# Patient Record
Sex: Female | Born: 1970 | Race: Black or African American | Hispanic: No | Marital: Married | Smoking: Never smoker
Health system: Southern US, Community
[De-identification: ages and names within clinical notes are randomized; demographics above are authoritative.]

## PROBLEM LIST (undated history)

## (undated) DIAGNOSIS — Z309 Encounter for contraceptive management, unspecified: Secondary | ICD-10-CM

## (undated) DIAGNOSIS — I10 Essential (primary) hypertension: Secondary | ICD-10-CM

## (undated) DIAGNOSIS — E7800 Pure hypercholesterolemia, unspecified: Secondary | ICD-10-CM

## (undated) HISTORY — DX: Essential (primary) hypertension: I10

## (undated) HISTORY — DX: Pure hypercholesterolemia, unspecified: E78.00

## (undated) HISTORY — DX: Encounter for contraceptive management, unspecified: Z30.9

---

## 2007-06-26 ENCOUNTER — Ambulatory Visit: Payer: Self-pay | Admitting: Family Medicine

## 2007-06-30 ENCOUNTER — Encounter: Payer: Self-pay | Admitting: Family Medicine

## 2007-06-30 LAB — CONVERTED CEMR LAB
BUN: 17 mg/dL (ref 6–23)
Basophils Absolute: 0 10*3/uL (ref 0.0–0.1)
Basophils Relative: 0 % (ref 0–1)
CO2: 23 meq/L (ref 19–32)
Calcium: 9.5 mg/dL (ref 8.4–10.5)
Chloride: 105 meq/L (ref 96–112)
Cholesterol: 221 mg/dL — ABNORMAL HIGH (ref 0–200)
Creatinine, Ser: 0.99 mg/dL (ref 0.40–1.50)
Eosinophils Absolute: 0.1 10*3/uL (ref 0.0–0.7)
Eosinophils Relative: 1 % (ref 0–5)
Glucose, Bld: 92 mg/dL (ref 70–99)
HCT: 40.4 % (ref 39.0–52.0)
HDL: 77 mg/dL (ref >39)
Hemoglobin: 13.8 g/dL (ref 13.0–17.0)
LDL Cholesterol: 133 mg/dL — ABNORMAL HIGH (ref 0–99)
Lymphocytes Relative: 54 % — ABNORMAL HIGH (ref 12–46)
Lymphs Abs: 3.1 10*3/uL (ref 0.7–3.3)
MCHC: 34.2 g/dL (ref 30.0–36.0)
MCV: 85.1 fL (ref 78.0–100.0)
Monocytes Absolute: 0.6 10*3/uL (ref 0.2–0.7)
Monocytes Relative: 10 % (ref 3–11)
Neutro Abs: 2 10*3/uL (ref 1.7–7.7)
Neutrophils Relative %: 35 % — ABNORMAL LOW (ref 43–77)
Platelets: 208 10*3/uL (ref 150–400)
Potassium: 4.2 meq/L (ref 3.5–5.3)
RBC: 4.75 M/uL (ref 4.22–5.81)
RDW: 13.7 % (ref 11.5–14.0)
Sodium: 139 meq/L (ref 135–145)
TSH: 1.754 microintl units/mL (ref 0.350–5.50)
Total CHOL/HDL Ratio: 2.9
Triglycerides: 55 mg/dL (ref <150)
VLDL: 11 mg/dL (ref 0–40)
WBC: 5.7 10*3/uL (ref 4.0–10.5)

## 2007-07-23 ENCOUNTER — Other Ambulatory Visit: Admission: RE | Admit: 2007-07-23 | Discharge: 2007-07-23 | Payer: Self-pay | Admitting: Obstetrics and Gynecology

## 2007-10-03 ENCOUNTER — Encounter: Payer: Self-pay | Admitting: Family Medicine

## 2008-07-29 ENCOUNTER — Ambulatory Visit: Payer: Self-pay | Admitting: Family Medicine

## 2008-07-29 DIAGNOSIS — E663 Overweight: Secondary | ICD-10-CM | POA: Insufficient documentation

## 2008-07-29 DIAGNOSIS — E66811 Obesity, class 1: Secondary | ICD-10-CM | POA: Insufficient documentation

## 2008-07-29 DIAGNOSIS — E669 Obesity, unspecified: Secondary | ICD-10-CM | POA: Insufficient documentation

## 2008-07-29 DIAGNOSIS — E785 Hyperlipidemia, unspecified: Secondary | ICD-10-CM | POA: Insufficient documentation

## 2008-08-02 DIAGNOSIS — L259 Unspecified contact dermatitis, unspecified cause: Secondary | ICD-10-CM | POA: Insufficient documentation

## 2008-08-02 DIAGNOSIS — K1370 Unspecified lesions of oral mucosa: Secondary | ICD-10-CM | POA: Insufficient documentation

## 2008-08-11 ENCOUNTER — Encounter: Payer: Self-pay | Admitting: Family Medicine

## 2008-09-03 ENCOUNTER — Other Ambulatory Visit: Admission: RE | Admit: 2008-09-03 | Discharge: 2008-09-03 | Payer: Self-pay | Admitting: Obstetrics and Gynecology

## 2008-11-15 ENCOUNTER — Encounter: Payer: Self-pay | Admitting: Family Medicine

## 2009-02-07 ENCOUNTER — Telehealth: Payer: Self-pay | Admitting: Family Medicine

## 2009-02-28 ENCOUNTER — Ambulatory Visit: Payer: Self-pay | Admitting: Family Medicine

## 2009-02-28 DIAGNOSIS — J370 Chronic laryngitis: Secondary | ICD-10-CM | POA: Insufficient documentation

## 2009-03-14 ENCOUNTER — Telehealth: Payer: Self-pay | Admitting: Family Medicine

## 2009-03-18 ENCOUNTER — Telehealth: Payer: Self-pay | Admitting: Family Medicine

## 2009-04-21 ENCOUNTER — Encounter: Admission: RE | Admit: 2009-04-21 | Discharge: 2009-04-21 | Payer: Self-pay | Admitting: Family Medicine

## 2009-04-21 ENCOUNTER — Encounter: Payer: Self-pay | Admitting: Family Medicine

## 2009-05-02 ENCOUNTER — Telehealth: Payer: Self-pay | Admitting: Family Medicine

## 2009-05-03 ENCOUNTER — Ambulatory Visit: Payer: Self-pay | Admitting: Family Medicine

## 2009-05-03 DIAGNOSIS — F411 Generalized anxiety disorder: Secondary | ICD-10-CM | POA: Insufficient documentation

## 2009-05-03 DIAGNOSIS — R519 Headache, unspecified: Secondary | ICD-10-CM | POA: Insufficient documentation

## 2009-05-03 DIAGNOSIS — R51 Headache: Secondary | ICD-10-CM | POA: Insufficient documentation

## 2009-05-19 ENCOUNTER — Telehealth: Payer: Self-pay | Admitting: Family Medicine

## 2009-08-31 ENCOUNTER — Ambulatory Visit: Payer: Self-pay | Admitting: Family Medicine

## 2009-09-02 ENCOUNTER — Encounter: Payer: Self-pay | Admitting: Family Medicine

## 2009-09-07 ENCOUNTER — Other Ambulatory Visit: Admission: RE | Admit: 2009-09-07 | Discharge: 2009-09-07 | Payer: Self-pay | Admitting: Obstetrics and Gynecology

## 2009-10-12 ENCOUNTER — Telehealth (INDEPENDENT_AMBULATORY_CARE_PROVIDER_SITE_OTHER): Payer: Self-pay | Admitting: *Deleted

## 2009-10-26 ENCOUNTER — Encounter: Payer: Self-pay | Admitting: Family Medicine

## 2009-11-28 ENCOUNTER — Telehealth: Payer: Self-pay | Admitting: Family Medicine

## 2010-02-14 ENCOUNTER — Telehealth: Payer: Self-pay | Admitting: Family Medicine

## 2010-03-13 ENCOUNTER — Ambulatory Visit: Payer: Self-pay | Admitting: Family Medicine

## 2010-03-13 DIAGNOSIS — M549 Dorsalgia, unspecified: Secondary | ICD-10-CM | POA: Insufficient documentation

## 2010-03-29 ENCOUNTER — Telehealth: Payer: Self-pay | Admitting: Family Medicine

## 2010-05-09 ENCOUNTER — Encounter: Admission: RE | Admit: 2010-05-09 | Discharge: 2010-05-09 | Payer: Self-pay | Admitting: Family Medicine

## 2010-05-22 ENCOUNTER — Telehealth: Payer: Self-pay | Admitting: Family Medicine

## 2010-07-10 ENCOUNTER — Telehealth: Payer: Self-pay | Admitting: Family Medicine

## 2010-07-18 ENCOUNTER — Encounter: Payer: Self-pay | Admitting: Family Medicine

## 2010-07-27 ENCOUNTER — Encounter: Payer: Self-pay | Admitting: Family Medicine

## 2010-09-11 ENCOUNTER — Encounter: Payer: Self-pay | Admitting: Family Medicine

## 2010-09-11 ENCOUNTER — Other Ambulatory Visit
Admission: RE | Admit: 2010-09-11 | Discharge: 2010-09-11 | Payer: Self-pay | Source: Home / Self Care | Admitting: Obstetrics and Gynecology

## 2010-09-26 ENCOUNTER — Telehealth: Payer: Self-pay | Admitting: Family Medicine

## 2010-10-24 NOTE — Progress Notes (Signed)
Summary: CALL  Phone Note Call from Patient   Summary of Call: SAID DR. CALLED AND LEFT MESSAGE CALL HER BACK AT [phone] EXT. [phone] Initial call taken by: Lind Guest,  Feb 14, 2010 11:04 AM  Follow-up for Phone Call        multiple msgs left , apersonal call however, Follow-up by: Syliva Overman MD,  Feb 20, 2010 1:46 AM

## 2010-10-24 NOTE — Letter (Signed)
Summary: [facility]  [facility]  neurology   Imported By: Lind Guest 07/27/2010 14:34:04  _____________________________________________________________________  External Attachment:    Type:   Image     Comment:   External Document

## 2010-10-24 NOTE — Progress Notes (Signed)
Summary: CALL BACK  Phone Note Call from Patient   Summary of Call: WANTS DR SIMPSON TO CALL HER AT [phone] EXT. [phone] Initial call taken by: Lind Guest,  May 22, 2010 10:36 AM  Follow-up for Phone Call        ok for pt to see me as new pt, his name is Sarah Lutz Follow-up by: Syliva Overman MD,  May 22, 2010 12:03 PM  Additional Follow-up for Phone Call Additional follow up Details #1::        left message Additional Follow-up by: Lind Guest,  May 22, 2010 12:52 PM    Additional Follow-up for Phone Call Additional follow up Details #2::    appt. 10.17.11 [name] Follow-up by: Lind Guest,  May 24, 2010 9:42 AM

## 2010-10-24 NOTE — Assessment & Plan Note (Signed)
Summary: office visit   Vital Signs:  Patient profile:   40 year old female Menstrual status:  regular Height:      59 inches O2 Sat:      98 % Pulse rate:   82 / minute Pulse rhythm:   regular Resp:     16 per minute BP sitting:   102 / 70  (left arm) Cuff size:   regular  Vitals Entered By: Everitt Amber LPN (March 13, 2010 10:32 AM) CC: Follow up chronic problems Comments Patient refused weight due to excess weight from uniform and equipment   CC:  Follow up chronic problems.  History of Present Illness: 6 day h/o intermittent mid back pain, worse on the weekend. No identifiable trigger, but is aggravated by movenmnt, relieved by Bayer asprin. Even tyingher shoelaxces is difficult. Reports  that she has otherwise been doing well. She recently got married, and is hoping to conceive in the near future. Denies recent fever or chills. Denies sinus pressure, nasal congestion , ear pain or sore throat. Denies chest congestion, or cough productive of sputum. Denies chest pain, palpitations, PND, orthopnea or leg swelling. Denies abdominal pain, nausea, vomitting, diarrhea or constipation. Denies change in bowel movements or bloody stool. Denies dysuria , frequency, incontinence or hesitancy.  Denies headaches, vertigo, seizures. Denies depression, anxiety or insomnia. Denies  rash, lesions, or itch.       Current Medications (verified): 1)  None  Allergies (verified): No Known Drug Allergies  Past History:  Past medical, surgical, family and social histories (including risk factors) reviewed, and no changes noted (except as noted below).  Past Medical History: Reviewed history from 07/29/2008 and no changes required. none  Past Surgical History: Reviewed history from 07/29/2008 and no changes required. none  Family History: Reviewed history from 07/29/2008 and no changes required. Family History Lung cancer-both Mother and Father Mother:49 died at age 16  BOOP Father:49, metastatic lung cancer, kidney stones Sister:33yo, healthy,  Brother:33yo, healthy    Social History: Reviewed history from 07/29/2008 and no changes required. Occupation: Scientist, product/process development and hairsylist Married in 2011 Never Smoked Alcohol use-no Drug use-no Regular exercise-no died age   Review of Systems      See HPI Eyes:  Denies blurring and discharge. Endo:  Denies excessive thirst and excessive urination. Heme:  Denies abnormal bruising and bleeding. Allergy:  Denies hives or rash and itching eyes.  Physical Exam  General:  Well-developed,well-nourished,in no acute distress; alert,appropriate and cooperative throughout examination HEENT: No facial asymmetry,  EOMI, No sinus tenderness, TM's Clear, oropharynx  pink and moist.   Chest: Clear to auscultation bilaterally.  CVS: S1, S2, No murmurs, No S3.   Abd: Soft, Nontender.  MS: Adequate ROM spine, hips, shoulders and knees.  Ext: No edema.   CNS: CN 2-12 intact, power tone and sensation normal throughout.   Skin: Intact, no visible lesions or rashes.  Psych: Good eye contact, normal affect.  Memory intact, not anxious or depressed appearing.    Impression & Recommendations:  Problem # 1:  BACK PAIN (ICD-724.5) Assessment Comment Only  Her updated medication list for this problem includes:    Vimovo 500-20 Mg Tbec (Naproxen-esomeprazole) .Marland Kitchen... Take 1 tablet by mouth two times a day for 5 to 7 days as needed for back pain , then use only for acute pain  Orders: Radiology Referral (Radiology)  Problem # 2:  ANXIETY STATE, UNSPECIFIED (ICD-300.00) Assessment: Improved  Problem # 3:  CHRONIC LARYNGITIS (ICD-476.0) Assessment: Improved  Problem # 4:  HYPERLIPIDEMIA (ICD-272.4) Assessment: Comment Only low fat diet discussed and encouraged  Complete Medication List: 1)  Vimovo 500-20 Mg Tbec (Naproxen-esomeprazole) .... Take 1 tablet by mouth two times a day for 5 to 7 days as needed for  back pain , then use only for acute pain 2)  Sm Prenatal Vitamins 28-0.8 Mg Tabs (Prenatal vit-fe fumarate-fa) .... Take 1 tablet by mouth once a day  Patient Instructions: 1)  F/U in 5 months and 3 weeks 2)  It is important that you exercise regularly at least 20 minutes 5 times a week. If you develop chest pain, have severe difficulty breathing, or feel very tired , stop exercising immediately and seek medical attention. 3)  You need to lose weight. Consider a lower calorie diet and regular exercise.  4)  You will be treated for back pain due to presumed arthritis. 5)  It is important for you to start taking a multivit with folic acid , and I suggestyou see a fertility specialist if you do not conceive within the next 6 months 6)  You dio not need top do the xray of your back if your pain resolves in the next week Prescriptions: SM PRENATAL VITAMINS 28-0.8 MG TABS (PRENATAL VIT-FE FUMARATE-FA) Take 1 tablet by mouth once a day  #30 x 5   Entered and Authorized by:   Syliva Overman MD   Signed by:   Syliva Overman MD on 03/13/2010   Method used:   Print then Give to Patient   RxID:   [id number] VIMOVO 500-20 MG TBEC (NAPROXEN-ESOMEPRAZOLE) Take 1 tablet by mouth two times a day for 5 to 7 days as needed for back pain , then use only for acute pain  #60 x 0   Entered and Authorized by:   Syliva Overman MD   Signed by:   Syliva Overman MD on 03/13/2010   Method used:   Print then Give to Patient   RxID:   [id number]

## 2010-10-24 NOTE — Letter (Signed)
Summary: Out of Work  [facility]  [address]   [place], [place] [address]   Phone: [phone]  Fax: [phone]    May 03, 2009   Employee:  Sarah Lutz    To Whom It May Concern:   For Medical reasons, please excuse the above named employee from work for the following dates:  Start:   05/04/09  End:   05/09/09  If you need additional information, please feel free to contact our office.         Sincerely,    Worthy Keeler LPN

## 2010-10-24 NOTE — Progress Notes (Signed)
  Phone Note Call from Patient   Caller: Patient Details for Reason: still hoarse Summary of Call: using her allergy pills daily, still very hoarse tough it comes and goes. She denies obvious reflux symptoms.She has a [place] appt for [employer]. i will start omeprazole empirically. She reports little caffeine intake and does not smoke Initial call taken by: Syliva Overman MD,  March 18, 2009 1:06 PM    New/Updated Medications: OMEPRAZOLE 20 MG CPDR (OMEPRAZOLE) Take 1 capsule by mouth once a day   Prescriptions: OMEPRAZOLE 20 MG CPDR (OMEPRAZOLE) Take 1 capsule by mouth once a day  #30 x 3   Entered and Authorized by:   Syliva Overman MD   Signed by:   Syliva Overman MD on 03/18/2009   Method used:   Electronically to        [facility] [address] [phone]* (retail)       [address] [place], [place]  [address]       Ph: [phone]       Fax: [phone]   RxID:   [id number]

## 2010-10-24 NOTE — Assessment & Plan Note (Signed)
Summary: OV   Vital Signs:  Patient profile:   40 year old female Menstrual status:  regular Height:      59 inches Weight:      145.25 pounds BMI:     29.44 Pulse rate:   75 / minute Resp:     16 per minute BP sitting:   116 / 70  (left arm)  Vitals Entered By: Worthy Keeler LPN (May 03, 2009 3:05 PM)  Nutrition Counseling: Patient's BMI is greater than 25 and therefore counseled on weight management options. CC: stress Is Patient Diabetic? No Pain Assessment Patient in pain? no        CC:  stress.  History of Present Illness: pt c/o increased stress in the past 2 weeks. 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 She reports poor sleep, and poorapetitie, irritability and anger about the situation, states she has threatened to quit.statesshe needs a mental health break. She has been trying to [employer] healthily and exercise , howwevere these lastcouple of weeksthis has not been possible. She also c/o frontal and temporal tightness. She denies localised weakness, numbness or any associated aura, she feels her symptoms is stress related also.   Current Medications (verified): 1)  Ortho Tri-Cyclen (28) 0.18/0.215/0.25 Mg-35 Mcg Tabs (Norgestim-Eth Estrad Triphasic) .... Uad  Allergies (verified): No Known Drug Allergies  Review of Systems General:  Complains of fatigue and sleep disorder; denies chills and fever. ENT:  Denies hoarseness, nasal congestion, sinus pressure, and sore throat. CV:  Denies chest pain or discomfort, palpitations, and swelling of feet. Resp:  Denies cough and sputum productive. GI:  See HPI; Complains of loss of appetite; denies abdominal pain, constipation, diarrhea, indigestion, nausea, and vomiting. GU:  Denies dysuria and urinary frequency. MS:  Denies joint pain and stiffness. Derm:  Denies itching, lesion(s), and rash. Neuro:   Complains of headaches; denies memory loss, poor balance, seizures, and sensation of room spinning. Psych:  Complains of anxiety, easily tearful, and irritability; denies suicidal thoughts/plans, thoughts of violence, and unusual visions or sounds; increased stress on the job for the past 2 weeks., she is doing the job for the other co-workers who do not do their job. Endo:  Denies cold intolerance, excessive thirst, excessive urination, and heat intolerance. Heme:  Denies abnormal bruising and bleeding. Allergy:  Denies hives or rash and itching eyes.  Physical Exam  General:  alert, well-nourished, and well-hydrated.  HEENT: No facial asymmetry,  EOMI, No sinus tenderness, TM's Clear, oropharynx  pink and moist.   Chest: Clear to auscultation bilaterally.  CVS: S1, S2, No murmurs, No S3.   Abd: Soft, Nontender.  MS: Adequate ROM spine, hips, shoulders and knees.  Ext: No edema.   CNS: CN 2-12 intact, power tone and sensation normal throughout.   Skin: Intact, no visible lesions or rashes.  Psych: Good eye contact, normal affect.  Memory intact, anxious  appearing.     Impression & Recommendations:  Problem # 1:  ANXIETY STATE, UNSPECIFIED (ICD-300.00) Assessment Deteriorated pt received counselling about coping with job stress and ventilated for approx 10 mins. No medication desired or needed at this time . Wk excuse granted   Problem # 2:  OVERWEIGHT (ICD-278.02) Assessment: Unchanged  Ht: 59 (05/03/2009)   Wt: 145.25 (05/03/2009)   BMI: 29.44 (05/03/2009)  Problem # 3:  HEADACHE (ICD-784.0) Assessment: Comment Only no neurologic deficits  on exam, and no alert symptoms, ibelieve this is due to sleep deprivation and tension, advised use of anti-inflammatory or tylenol if needed  Complete Medication List: 1)  Ortho Tri-cyclen (28) 0.18/0.215/0.25 Mg-35 Mcg Tabs (Norgestim-eth estrad triphasic) .... Uad  Other Orders: T-Basic Metabolic Panel [id number]) T-CBC w/Diff  [id number]) T-Lipid Profile [id number]) T-TSH [id number])  Patient Instructions: 1)  F/U in 5.5 months. 2)  It is important that you exercise regularly at least 30 minutes 6 times a week. If you develop chest pain, have severe difficulty breathing, or feel very tired , stop exercising immediately and seek medical attention. 3)  You need to lose weight. Consider a lower calorie diet and regular exercise.  4)  Pls  eat healthily and ensure you get  8 hrs of sleep regularly. 5)  Work excuse start  on 05/04/2009 and return  05/09/2009 6)  BMP prior to visit, ICD-9: 7)  Lipid Panel prior to visit, ICD-9: 8)  TSH prior to visit, ICD-9: 9)  CBC w/ Diff prior to visit, ICD-9:

## 2010-10-24 NOTE — Progress Notes (Signed)
Summary: rx  Phone Note Call from Patient   Summary of Call: would like to get medicine that will  not make her sick on cruise. [phone] ext [phone]    Follow-up for Phone Call        pls advise med is sent to her pharmacy, and enjoy the cruise Follow-up by: Syliva Overman MD,  November 30, 2009 12:35 PM  Additional Follow-up for Phone Call Additional follow up Details #1::        called patient, left message Additional Follow-up by: Adella Hare LPN,  November 30, 2009 1:10 PM    Additional Follow-up for Phone Call Additional follow up Details #2::    rx sent, patient aware Follow-up by: Adella Hare LPN,  December 01, 2009 9:05 AM  New/Updated Medications: TRANSDERM-SCOP 1.5 MG PT72 (SCOPOLAMINE BASE) apply 1 disc ehind ear 4 hours before sailing, replace every 3 days Prescriptions: TRANSDERM-SCOP 1.5 MG PT72 (SCOPOLAMINE BASE) apply 1 disc ehind ear 4 hours before sailing, replace every 3 days  #8 x 0   Entered and Authorized by:   Syliva Overman MD   Signed by:   Syliva Overman MD on 11/30/2009   Method used:   Electronically to        [facility] [address] [phone]* (retail)       [address], [place]  [address]       Ph: [phone]       Fax: [phone]   RxID:   [id number]

## 2010-10-24 NOTE — Letter (Signed)
Summary: Out of Work  All     ,     Phone:   Fax:     February 28, 2009   Employee:  Ardyth Man    To Whom It May Concern:   For Medical reasons, please excuse the above named employee from work for the following dates:  Start:   03/01/09  End:   03/03/09  If you need additional information, please feel free to contact our office.         Sincerely,    Worthy Keeler LPN

## 2010-10-24 NOTE — Progress Notes (Signed)
[facility]   Imported By: Lind Guest 09/09/2009 12:20:30  _____________________________________________________________________  External Attachment:    Type:   Image     Comment:   External Document

## 2010-10-24 NOTE — Assessment & Plan Note (Signed)
Summary: office visit   Vital Signs:  Patient profile:   40 year old female Menstrual status:  regular Height:      59 inches Weight:      136.25 pounds BMI:     27.62 O2 Sat:      98 % Pulse rate:   84 / minute Pulse rhythm:   regular Resp:     16 per minute BP sitting:   112 / 72 Cuff size:   regular  Vitals Entered By: Everitt Amber (August 31, 2009 8:01 AM)  Nutrition Counseling: Patient's BMI is greater than 25 and therefore counseled on weight management options. CC: follow-up visit   CC:  follow-up visit.  History of Present Illness: Reports  that they are doing well. Denies recent fever or chills. Denies sinus pressure, nasal congestion , ear pain or sore throat. Denies chest congestion, or cough productive of sputum. Denies chest pain, palpitations, PND, orthopnea or leg swelling. Denies abdominal pain, nausea, vomitting, diarrhea or constipation. Denies change in bowel movements or bloody stool. Denies dysuria , frequency, incontinence or hesitancy. Denies  joint pain, swelling, or reduced mobility. Denies headaches, vertigo, seizures. Denies depression, anxiety or insomnia. Denies  rash, lesions, or itch.     Preventive Screening-Counseling & Management  Alcohol-Tobacco     Smoking Status: never  Current Medications (verified): 1)  Ortho Tri-Cyclen (28) 0.18/0.215/0.25 Mg-35 Mcg Tabs (Norgestim-Eth Estrad Triphasic) .... Uad  Allergies (verified): No Known Drug Allergies  Review of Systems      See HPI Eyes:  Denies blurring, discharge, and red eye. Derm:  Denies itching, lesion(s), and rash. Neuro:  Denies headaches, seizures, and sensation of room spinning. Heme:  Denies abnormal bruising and bleeding. Allergy:  Denies hives or rash and itching eyes.  Physical Exam  General:  Well-developed,well-nourished,in no acute distress; alert,appropriate and cooperative throughout examination HEENT: No facial asymmetry,  EOMI, No sinus tenderness,  TM's Clear, oropharynx  pink and moist.   Chest: Clear to auscultation bilaterally.  CVS: S1, S2, No murmurs, No S3.   Abd: Soft, Nontender.  MS: Adequate ROM spine, hips, shoulders and knees.  Ext: No edema.   CNS: CN 2-12 intact, power tone and sensation normal throughout.   Skin: Intact, no visible lesions or rashes.  Psych: Good eye contact, normal affect.  Memory intact, not anxious or depressed appearing.    Impression & Recommendations:  Problem # 1:  ANXIETY STATE, UNSPECIFIED (ICD-300.00) Assessment Improved  Problem # 2:  CHRONIC LARYNGITIS (ICD-476.0) Assessment: Improved  Problem # 3:  OVERWEIGHT (ICD-278.02) Assessment: Improved  Ht: 59 (08/31/2009)   Wt: 136.25 (08/31/2009)   BMI: 27.62 (08/31/2009)  Problem # 4:  HYPERLIPIDEMIA (ICD-272.4) Assessment: Comment Only counselled re low fatidet, and awaiting most recent lipid panel from this year  Complete Medication List: 1)  Ortho Tri-cyclen (28) 0.18/0.215/0.25 Mg-35 Mcg Tabs (Norgestim-eth estrad triphasic) .... Uad  Patient Instructions: 1)  Return in [place]. 2)  It is important that you exercise regularly at least 40 minutes 5 times a week. If you develop chest pain, have severe difficulty breathing, or feel very tired , stop exercising immediately and seek medical attention. 3)  You need to lose weight. Consider a lower calorie diet and regular exercise.  4)  Congrats on weight loss, pls keep it up. 5)  Please increase your fresh fruit  intake.

## 2010-10-24 NOTE — Progress Notes (Signed)
Summary: speak with doc  Phone Note Call from Patient   Summary of Call: would like to speak with dr. Lodema Hong. [phone] ext [phone]   Initial call taken by: Rudene Anda,  July 10, 2010 3:15 PM  Follow-up for Phone Call        returned call, left message this patient usually only wants to speak directly with dr simpson Follow-up by: Adella Hare LPN,  July 11, 2010 12:32 PM  Additional Follow-up for Phone Call Additional follow up Details #1::        patient states from time to time she has a pain in her head, when she laughs or makes a sudden movement, states its kinda like a brain freeze, i worries her because she has had a couple people she knows her age pass suddenly. Additional Follow-up by: Adella Hare LPN,  July 12, 2010 8:25 AM    Additional Follow-up for Phone Call Additional follow up Details #2::    pls sched an ov with me so she can get the eval started some time this week , this will be faster than trying to get directly to neurology Follow-up by: Syliva Overman MD,  July 13, 2010 5:10 PM  Additional Follow-up for Phone Call Additional follow up Details #3:: Details for Additional Follow-up Action Taken: [phone] is her call back number. Additional Follow-up by: Curtis Sites,  July 18, 2010 10:12 AM  I have already asked specifically for an appt to be made  Appended Document: speak with doc APPT. DR. Gerilyn Pilgrim 3:00 BE THERE 30 MINUTES EARLY PARIENT AWARE

## 2010-10-24 NOTE — Assessment & Plan Note (Signed)
Summary: ov   Vital Signs:  Patient profile:   40 year old female Menstrual status:  regular O2 Sat:      98 % Pulse rate:   68 / minute Pulse rhythm:   regular Resp:     16 per minute BP sitting:   104 / 74  (left arm) Cuff size:   regular  Vitals Entered By: Everitt Amber (February 28, 2009 2:59 PM) CC: called in allegra lastweek but she is still hoarse. She isn't congested or anything just doesn't have her voice. Gone for about a month now  years   days  Menstrual Status regular   CC:  called in allegra lastweek but she is still hoarse. She isn't congested or anything just doesn't have her voice. Gone for about a month now.  History of Present Illness: 1 month h/o painless hoarseness. Symptoms first started with uncontrolled allergy symptoms, allegra helped , but the hoarseness has lingered. She notes at tmes that she produces yellow sputum and that she has yellow post nasal drainage.She denies any recent fever or chills. She denies dyspnea or excessive cough. shdeniesabdominal pain or any symptoms of reflux. she continues to exercise and watch her diet to maintain weight loss and a healthy lifestyle.   Current Medications (verified): 1)  Fexofenadine Hcl 180 Mg Tabs (Fexofenadine Hcl) .... Take 1 Tablet By Mouth Once A Day  Allergies (verified): No Known Drug Allergies  Review of Systems      See HPI General:  Denies chills and fever. ENT:  See HPI. CV:  Denies chest pain or discomfort, palpitations, and swelling of feet. Resp:  See HPI. GI:  Denies abdominal pain, constipation, diarrhea, nausea, and vomiting. GU:  Denies dysuria and urinary frequency.  Physical Exam  General:  alert, well-nourished, and well-hydrated. HEENT: No facial asymmetry,  EOMI, No sinus tenderness, TM's Clear, oropharynx  pink and moist.   Chest: Clear to auscultation bilaterally.  CVS: S1, S2, No murmurs, No S3.   Abd: Soft, Nontender.  MS: Adequate ROM spine, hips, shoulders and knees.    Ext: No edema.   CNS: CN 2-12 intact, power tone and sensation normal throughout.   Skin: Intact, no visible lesions or rashes.  Psych: Good eye contact, normal affect.  Memory intact, not anxious or depressed appearing.      Impression & Recommendations:  Problem # 1:  CHRONIC LARYNGITIS (ICD-476.0) Assessment Comment Only prednisone dose pack and course of antibiotics and voice rest. If symptoms persist , pt to call back for ENT referral  Problem # 2:  HYPERLIPIDEMIA (ICD-272.4) Assessment: Comment Only    HDL:77 (06/30/2007)  LDL:133 (06/30/2007)  Chol:221 (06/30/2007)  Trig:55 (06/30/2007), low fat diet only, 2009 labs are improved  Complete Medication List: 1)  Fexofenadine Hcl 180 Mg Tabs (Fexofenadine hcl) .... Take 1 tablet by mouth once a day 2)  Prednisone (pak) 5 Mg Tabs (Prednisone) .... Use as directed 3)  Veetids 500 Mg Tabs (Penicillin v potassium) .... Take 1 tablet by mouth three times a day 4)  Fluconazole 100 Mg Tabs (Fluconazole) .... Take 1 tablet by mouth once a day as needed  Patient Instructions: 1)  Yopu are being treated for chronic laryngitis. 2)  If you remain hoarse pls call back for referall to ENT. 3)  Meds are sent to your pharmacy.  4)  voice rest, for the next 2 days, and a work excuse Prescriptions: FLUCONAZOLE 100 MG TABS (FLUCONAZOLE) Take 1 tablet by mouth once a  day as needed  #3 x 0   Entered and Authorized by:   Syliva Overman MD   Signed by:   Syliva Overman MD on 02/28/2009   Method used:   Electronically to        [facility] [address] [phone]* (retail)       [address] [place]  [address]       Ph: [phone]       Fax: [phone]   RxID:   [id number] VEETIDS 500 MG TABS (PENICILLIN V POTASSIUM) Take 1 tablet by mouth three times a day  #30 x 0   Entered and Authorized by:   Syliva Overman MD   Signed by:   Syliva Overman MD on 02/28/2009   Method used:   Electronically to        [facility] [address] [phone]* (retail)       [address] [place]  [address]       Ph: [phone]       Fax: [phone]   RxID:   [id number] PREDNISONE (PAK) 5 MG TABS (PREDNISONE) Use as directed  #21 x 0   Entered and Authorized by:   Syliva Overman MD   Signed by:   Syliva Overman MD on 02/28/2009   Method used:   Electronically to        [facility] [address] [phone]* (retail)       [address] [place], [place]  [address]       Ph: [phone]       Fax: [phone]   RxID:   [id number]   Appended Document: ov pls rebill at a level 3, thanks  Appended Document: ov THIS WILL BE BILLED AT A LEVEL 3

## 2010-10-24 NOTE — Miscellaneous (Signed)
  Clinical Lists Changes  Orders: Added new Referral order of Neurology Referral (Neuro) - Signed 

## 2010-10-24 NOTE — Assessment & Plan Note (Signed)
Summary: PHY   Vital Signs:  Patient Profile:   40 Years Old Female Height:     59 inches (149.86 cm) Weight:      140 pounds (63.64 kg) BMI:     28.38 BSA:     1.59 O2 Sat:      98 % Pulse rate:   74 / minute Resp:     16 per minute BP sitting:   110 / 70  (left arm)  Pt. in pain?   no  Vitals Entered By: Everitt Amber (July 29, 2008 7:59 AM)               Vision Screening: Left eye with correction: 20 / 20 Right eye with correction: 20 / 20 Both eyes with correction: 20 / 20  Color vision testing: normal      Vision Entered By: Everitt Amber (July 29, 2008 8:06 AM)   Chief Complaint:  Follow up and Physical.  History of Present Illness: Pt. is generally doing well and has no concerns other than hyperlipidemia and being overweight. She has made an ffort at reducing her fat intake, but as far as calories and exercise are concerned, there has been alot of inconsistence. The pt. continues to drink large amts of sweetrened tea. She does plan to change htis.as wellas to recommit to exercise. She does have a concern about an oral lesion which has been present for some time which reportedly has not changed in size, itis pigmented.     Current Allergies: No known allergies   Past Medical History:    none  Past Surgical History:    none   Family History:    Family History Lung cancer-both Mother and Father    Mother:49 died at age 76 BOOP    Father:49, metastatic lung cancer, kidney stones    Sister:33yo, healthy,     Brother:33yo, healthy       Social History:    Occupation: Armed forces operational officer    Single    Never Smoked    Alcohol use-no    Drug use-no    Regular exercise-no died age     Review of Systems  General      Denies chills, fatigue, and fever.  ENT      Denies earache, hoarseness, nasal congestion, and sinus pressure.  CV      Denies chest pain or discomfort, palpitations, shortness of breath with exertion, and  swelling of feet.  Resp      Denies cough, sputum productive, and wheezing.  GI      Denies abdominal pain, constipation, diarrhea, nausea, and vomiting.  GU      Denies abnormal vaginal bleeding, discharge, dysuria, and urinary frequency.  MS      Denies joint pain, low back pain, mid back pain, and stiffness.  Neuro      Denies headaches, poor balance, and seizures.  Psych      Denies anxiety and depression.  Endo      Denies cold intolerance, excessive hunger, excessive thirst, excessive urination, heat intolerance, polyuria, and weight change.  Allergy      Complains of seasonal allergies.   Physical Exam  General:     Well-developed,well-nourished,in no acute distress; alert,appropriate and cooperative throughout examination Head:     Normocephalic and atraumatic without obvious abnormalities. No apparent alopecia or balding. Eyes:     No corneal or conjunctival inflammation noted. EOMI. Perrla. Funduscopic exam benign, without hemorrhages, exudates or papilledema.  Vision grossly normal. Ears:     External ear exam shows no significant lesions or deformities.  Otoscopic examination reveals clear canals, tympanic membranes are intact bilaterally without bulging, retraction, inflammation or discharge. Hearing is grossly normal bilaterally. Nose:     External nasal examination shows no deformity or inflammation. Nasal mucosa are pink and moist without lesions or exudates. Mouth:     hyperpigmented lesion on mucosa of R cheek, macular, max dia approx 1.5cmgood dentition and pharynx pink and moist.   Neck:     No deformities, masses, or tenderness noted. Chest Wall:     No deformities, masses, or tenderness noted. Breasts:     No mass, nodules, thickening, tenderness, bulging, retraction, inflamation, nipple discharge or skin changes noted.   Lungs:     Normal respiratory effort, chest expands symmetrically. Lungs are clear to auscultation, no crackles or  wheezes. Heart:     Normal rate and regular rhythm. S1 and S2 normal without gallop, murmur, click, rub or other extra sounds. Abdomen:     Bowel sounds positive,abdomen soft and non-tender without masses, organomegaly or hernias noted. Rectal:     deferred Genitalia:     deferred, pt sees gynae Msk:     No deformity or scoliosis noted of thoracic or lumbar spine.   Pulses:     R and L carotid,radial,femoral,dorsalis pedis and posterior tibial pulses are full and equal bilaterally Extremities:     No clubbing, cyanosis, edema, or deformity noted with normal full range of motion of all joints.   Neurologic:     No cranial nerve deficits noted. Station and gait are normal. Plantar reflexes are down-going bilaterally. DTRs are symmetrical throughout. Sensory, motor and coordinative functions appear intact. Skin:     Intact without suspicious lesions or rashes except hyperpigment maculapapular rash on r jaw Cervical Nodes:     No lymphadenopathy noted Axillary Nodes:     No palpable lymphadenopathy Psych:     Cognition and judgment appear intact. Alert and cooperative with normal attention span and concentration. No apparent delusions, illusions, hallucinations    Impression & Recommendations:  Problem # 1:  HYPERLIPIDEMIA (ICD-272.4) Assessment: Comment Only low fat diet discussed and info. given. Fasting labs in the next 2 months  Problem # 2:  OVERWEIGHT (ICD-278.02) Assessment: Comment Only TLC  discussed and encouraged  Problem # 3:  CONTACT DERMATITIS&OTHER ECZEMA DUE UNSPEC CAUSE (ICD-692.9) Assessment: Comment Only hydrocortisone sparingly twice daily as needed for max of 5 days, derm referral if persisits  Problem # 4:  OTHER&UNSPECIFIED DISEASES THE ORAL SOFT TISSUES (ICD-528.9) Assessment: Comment Only ENT referral recommended, pt. chooses to defer   Patient Instructions: 1)  Follow up in 5 month and  3weeks. 2)  It is important that you exercise regularly at  least 20 minutes 5 times a week. If you develop chest pain, have severe difficulty breathing, or feel very tired , stop exercising immediately and seek medical attention. 3)  You need to lose weight. Consider a lower calorie diet and regular exercise.  4)  GOAL  weight loss is 5 to 10 pounds. 5)  PLEASE follow a low fat diet which we discussed and you MAY need to rept your lipid panel in 6 months. 6)  Self breast exams recommended. 7)  Schedule your mammogram. In the month of March after you become 38. 8)  I suggest that if the lesion in your mouth be reviewed by an oral specialist   if there is a  change in size or pigmentaton this is definitely not a good sign.Call if you want a referral.  9)  Please use low strength hydrocortisone sparingly  once daily to the area on your R jaw for 4 to 5 days, if you are still concerned you may call for a refferal. 10)  Please drop off your labs when available.    ]

## 2010-10-24 NOTE — Progress Notes (Signed)
Summary: CALL  Phone Note Call from Patient   Summary of Call: THIS [facility] BETTER    Initial call taken by: Lind Guest,  May 19, 2009 8:46 AM  Follow-up for Phone Call        advise I am glad to hear this Follow-up by: Syliva Overman MD,  May 19, 2009 1:18 PM  Additional Follow-up for Phone Call Additional follow up Details #1::        Returned call, no answer Additional Follow-up by: Everitt Amber,  May 19, 2009 4:30 PM    Additional Follow-up for Phone Call Additional follow up Details #2::    NO ANSWER Follow-up by: Everitt Amber,  May 20, 2009 1:50 PM

## 2010-10-24 NOTE — Progress Notes (Signed)
Summary: speak to doc  Phone Note Call from Patient   Summary of Call: would like to speak to dr. Lodema Hong [phone]   cell [phone]  Initial call taken by: Rudene Anda,  May 02, 2009 10:07 AM  Follow-up for Phone Call        called patient twice today, unavailable Follow-up by: Worthy Keeler LPN,  May 02, 2009 2:54 PM  Additional Follow-up for Phone Call Additional follow up Details #1::        patient refuses to give any information wants to speak with dr simpson directly Additional Follow-up by: Worthy Keeler LPN,  May 02, 2009 3:29 PM    Additional Follow-up for Phone Call Additional follow up Details #2::    attempted both nos. [pt did not answerthe cell and she will be at work in am. Follow-up by: Syliva Overman MD,  May 02, 2009 5:16 PM  Additional Follow-up for Phone Call Additional follow up Details #3:: Details for Additional Follow-up Action Taken: plscall pt at the work no, not her cell after 8:00 in the morning , but beefore 12 and get me to the phone thanks Additional Follow-up by: Syliva Overman MD,  May 02, 2009 5:16 PM  pls add to schedule for a 3:15 appt

## 2010-10-24 NOTE — Letter (Signed)
Summary: Work Excuse  [facility]  [address]   [place], [place] [address]   Phone: [phone]  Fax: [phone]    Today's Date: May 03, 2009  Name of Patient: Sarah Lutz  The above named patient had a medical visit today at:  am / pm.  Please take this into consideration when reviewing the time away from work/school.    Special Instructions:  [ * ] None  [  ] To be off the remainder of today, returning to the normal work / school schedule tomorrow.  [  ] To be off until the next scheduled appointment on ______________________.  [  ] Other ________________________________________________________________ ________________________________________________________________________   Sincerely yours,   Worthy Keeler LPN

## 2010-10-24 NOTE — Progress Notes (Signed)
Summary: bill coded wrong  Phone Note Call from Patient   Summary of Call: she received a bill for Sep 05, 2023 and was coded for a sick visit and she was here for her yearly check up needs to be recoded  call back at [phone] ext [phone] or cell [phone] Initial call taken by: Lind Guest,  October 12, 2009 8:20 AM  Follow-up for Phone Call        rob pls check into this Follow-up by: Syliva Overman MD,  October 12, 2009 10:43 AM  Additional Follow-up for Phone Call Additional follow up Details #1::        rob, since this was a billing/management concern i had sent it to you, now it is routed back to me . I had opriginally coded a level 3 f/u, pls give some help here , thanks, i figured all billing probs should be initially sent to you and I do not spk with pts about this, pls let me know if otherwise Additional Follow-up by: Syliva Overman MD,  October 18, 2009 12:32 PM    Additional Follow-up for Phone Call Additional follow up Details #2::    Doc,  Dont know how this got back to you sorry.  Here is the scoop.  Pt states she was here for a physical.  [employer] will not pay this unless we use a preventative exam code such as [phone] with possible V70.0 diagnosis code.  Just need your permission to do this.  2 Feb 11, this has been resubmitted to the insurance carrier. via charge correction.   Follow-up by: Altamease Oiler,  October 21, 2009 7:06 PM  Additional Follow-up for Phone Call Additional follow up Details #3:: Details for Additional Follow-up Action Taken: pls bill as necessary, thanks Additional Follow-up by: Syliva Overman MD,  October 22, 2009 3:17 AM

## 2010-10-24 NOTE — Progress Notes (Signed)
Summary: referral  Phone Note Call from Patient   Summary of Call: pt is still hoarse and needs a referral to ent. [phone] Initial call taken by: Rudene Anda,  March 14, 2009 8:57 AM  Follow-up for Phone Call        advise and refer to ENT to evaluate chronic painless hoarseness pls Follow-up by: Syliva Overman MD,  March 14, 2009 10:14 AM  Additional Follow-up for Phone Call Additional follow up Details #1::        pt has appt with dr. Jenne Pane for 04/18/2009 2:30. pt aware.  Additional Follow-up by: Rudene Anda,  March 17, 2009 9:52 AM

## 2010-10-24 NOTE — Progress Notes (Signed)
Summary: Sarah Lutz  Sarah Lutz   Imported By: Lind Guest 12/15/2008 08:38:38  _____________________________________________________________________  External Attachment:    Type:   Image     Comment:   External Document

## 2010-10-24 NOTE — Progress Notes (Signed)
Summary: please advise about blood work  Phone Note Call from Patient   Summary of Call: patient called and would like to discuss her blood work please call her at [phone] Initial call taken by: Curtis Sites,  March 29, 2010 11:57 AM  Follow-up for Phone Call        returned call, left message Follow-up by: Adella Hare LPN,  March 29, 8118 1:32 PM  Additional Follow-up for Phone Call Additional follow up Details #1::        states nevermind, no question Additional Follow-up by: Adella Hare LPN,  March 29, 1477 1:40 PM

## 2010-10-24 NOTE — Progress Notes (Signed)
Summary: CALL  Phone Note Call from Patient   Summary of Call: WANTS THE DR TO CALL HER AT [phone] ABOUT HER ALLG Initial call taken by: Lind Guest,  Feb 07, 2009 8:38 AM  Follow-up for Phone Call        Called pt to see what all was going on and she said she already told the lady up front and to just forget about it. I didn't see where anything was written but allg? I was trying to get more information before sending it to you. Follow-up by: Everitt Amber,  Feb 07, 2009 2:35 PM  Additional Follow-up for Phone Call Additional follow up Details #1::        pls call if she c/o aleergy symptoms advise and erx flonase 1 puff per nostril daily # 1 refill 3 , if she does not want nose spray then any allergy tab of her choice advise alot of hem are no longer covered by ins , try fexofenadine 180mg  1 daily 330 refill3. If she wants the nose spray you dont need the tab, I called but she had left work . If any sympts of infection needs ov Additional Follow-up by: Syliva Overman MD,  Feb 07, 2009 6:14 PM    Additional Follow-up for Phone Call Additional follow up Details #2::    Returned call, no answer Follow-up by: Everitt Amber,  Feb 08, 2009 1:29 PM  Additional Follow-up for Phone Call Additional follow up Details #3:: Details for Additional Follow-up Action Taken: Patient aware, got fexofenadine and will call back if no better. Patient c/o clear drainage with hoarseness. No sign of infection but will come in if allegra doesn't help Additional Follow-up by: Everitt Amber,  Feb 09, 2009 8:22 AM  New/Updated Medications: FEXOFENADINE HCL 180 MG TABS (FEXOFENADINE HCL) Take 1 tablet by mouth once a day   Prescriptions: FEXOFENADINE HCL 180 MG TABS (FEXOFENADINE HCL) Take 1 tablet by mouth once a day  #30 x 3   Entered by:   Everitt Amber   Authorized by:   Syliva Overman MD   Signed by:   Everitt Amber on 02/09/2009   Method used:   Electronically to        [facility] [address] [phone]*  (retail)       [address], [place]  [address]       Ph: [phone]       Fax: [phone]   RxID:   [id number]

## 2010-10-24 NOTE — Letter (Signed)
Summary: HEALTHY LIFE PROFILE REPORT  HEALTHY LIFE PROFILE REPORT   Imported By: Lind Guest 10/26/2009 14:03:18  _____________________________________________________________________  External Attachment:    Type:   Image     Comment:   External Document

## 2010-10-24 NOTE — Progress Notes (Signed)
Summary: [facility]  [facility]   Imported By: Lind Guest 04/22/2009 09:42:26  _____________________________________________________________________  External Attachment:    Type:   Image     Comment:   External Document

## 2010-10-26 NOTE — Progress Notes (Signed)
Summary: back pain  Phone Note Call from Patient   Summary of Call: having back pain and wanted a ppt. and dr not here so she wants you to call her and maybe tell her some things to do  [phone] ext. [phone] Initial call taken by: Lind Guest,  September 26, 2010 8:27 AM  Follow-up for Phone Call        returned call, left message Follow-up by: Adella Hare LPN,  September 26, 2010 9:17 AM  Additional Follow-up for Phone Call Additional follow up Details #1::        patient states she still has some vimovo left, advised to take per directions Additional Follow-up by: Adella Hare LPN,  September 26, 2010 9:27 AM

## 2010-10-26 NOTE — Letter (Signed)
[facility]   Imported By: Lind Guest 09/13/2010 13:05:41  _____________________________________________________________________  External Attachment:    Type:   Image     Comment:   External Document

## 2010-12-13 ENCOUNTER — Telehealth: Payer: Self-pay | Admitting: Family Medicine

## 2010-12-21 NOTE — Telephone Encounter (Signed)
Tried to call pt back at work several times and it gave me options for different depts and the ones I tried just gave option to leave meg. Mailed patient letter to call back

## 2010-12-25 ENCOUNTER — Telehealth: Payer: Self-pay

## 2010-12-25 ENCOUNTER — Encounter: Payer: Self-pay | Admitting: Family Medicine

## 2010-12-25 NOTE — Telephone Encounter (Signed)
I suggest to try and catch it early she take advil (otc) or ibuprofen 200mg  (otc) one 3 times daily one 3 times daily for 5 days and see if this helps, if not allergic to either med

## 2010-12-25 NOTE — Telephone Encounter (Signed)
She said it doesn't hurt bad enough to take it all the time but she does take it occasionally for it

## 2011-01-15 ENCOUNTER — Encounter: Payer: Self-pay | Admitting: Family Medicine

## 2011-01-16 ENCOUNTER — Encounter: Payer: Self-pay | Admitting: Family Medicine

## 2011-01-16 ENCOUNTER — Ambulatory Visit (INDEPENDENT_AMBULATORY_CARE_PROVIDER_SITE_OTHER): Payer: 59 | Admitting: Family Medicine

## 2011-01-16 VITALS — BP 110/70 | HR 70 | Resp 16 | Ht 60.0 in | Wt 148.1 lb

## 2011-01-16 DIAGNOSIS — E663 Overweight: Secondary | ICD-10-CM

## 2011-01-16 DIAGNOSIS — M25519 Pain in unspecified shoulder: Secondary | ICD-10-CM

## 2011-01-16 DIAGNOSIS — M25511 Pain in right shoulder: Secondary | ICD-10-CM

## 2011-01-16 DIAGNOSIS — Z6825 Body mass index (BMI) 25.0-25.9, adult: Secondary | ICD-10-CM

## 2011-01-16 NOTE — Patient Instructions (Addendum)
Work excuse from 04/25 to 04 /26/2012  It is important that you exercise regularly at least 30 minutes 5 times a week. If you develop chest pain, have severe difficulty breathing, or feel very tired, stop exercising immediately and seek medical attention  A healthy diet is rich in fruit, vegetables and whole grains. Poultry fish, nuts and beans are a healthy choice for protein rather then red meat. A low sodium diet and drinking 64 ounces of water daily is generally recommended. Oils and sweet should be limited. Carbohydrates especially for those who are diabetic or overweight, should be limited to 34-45 gram per meal. It is important to eat on a regular schedule, at least 3 times daily. Snacks should be primarily fruits, vegetables or nuts.  Pls drop off  labs done  Pls ensure you take advil one twice daily for 7 days, if you still have the pain in right shoulder , then get the xray.I will order if you call  Stress management needs to be improved, pls plan to lose 6 pounds in the next 6 months, Stop drinking sugar, and resume your exercise, have fun!!!

## 2011-01-16 NOTE — Progress Notes (Signed)
  Subjective:    Patient ID: Sarah Lutz, female    DOB: 03-21-1971, 40 y.o.   MRN: [id number]  HPI C/O right upper arm and shoulder pain for 3 months. Painful to laterally extend her arms. Feels stressed espescially on the job, wants a few days off. She reports that she has not been exercising regularly as she should  And is concerned about weight gain. She is required to have an annual exam for insurance purposes on her job, she gets both breast and pelvic exams from gynae     Review of Systems Denies recent fever or chills. Denies sinus pressure, nasal congestion, ear pain or sore throat. Denies chest congestion, productive cough or wheezing. Denies chest pains, palpitations, paroxysmal nocturnal dyspnea, orthopnea and leg swelling Denies abdominal pain, nausea, vomiting,diarrhea or constipation.  Denies rectal bleeding or change in bowel movement. Denies dysuria, frequency, hesitancy or incontinence. Denies headaches, seizure, numbness, or tingling. Denies depression, anxiety or insomnia. Denies skin break down or rash.       Objective:   Physical Exam Patient alert and oriented and in no Cardiopulmonary distress.  HEENT: No facial asymmetry, EOMI, no sinus tenderness, TM's clear, Oropharynx pink and moist.  Neck supple no adenopathy.  Chest: Clear to auscultation bilaterally.  CVS: S1, S2 no murmurs, no S3.  ABD: Soft non tender. Bowel sounds normal.  Ext: No edema  MS: Adequate ROM spine, shoulders, hips and knees.  Skin: Intact, no ulcerations or rash noted.  Psych: Good eye contact, normal affect. Memory intact not anxious or depressed appearing.  CNS: CN 2-12 intact, power, tone and sensation normal throughout.        Assessment & Plan:  Exam within normal including ROM of left shoulder. Advised use of anti-inflammatories short term, call back if symptoms persist or worsen for ortho eval Overweight: regular exercise and reduction in caloric intake  advised

## 2011-01-22 ENCOUNTER — Encounter: Payer: Self-pay | Admitting: Family Medicine

## 2011-04-09 ENCOUNTER — Other Ambulatory Visit: Payer: Self-pay | Admitting: Family Medicine

## 2011-04-09 DIAGNOSIS — Z1231 Encounter for screening mammogram for malignant neoplasm of breast: Secondary | ICD-10-CM

## 2011-05-11 ENCOUNTER — Ambulatory Visit: Payer: 59

## 2011-05-14 ENCOUNTER — Ambulatory Visit
Admission: RE | Admit: 2011-05-14 | Discharge: 2011-05-14 | Disposition: A | Discharge status: Home / Self Care | Payer: 59 | Source: Ambulatory Visit | Attending: Family Medicine | Admitting: Family Medicine

## 2011-05-14 DIAGNOSIS — Z1231 Encounter for screening mammogram for malignant neoplasm of breast: Secondary | ICD-10-CM

## 2011-09-03 ENCOUNTER — Encounter: Payer: Self-pay | Admitting: Family Medicine

## 2011-09-04 ENCOUNTER — Other Ambulatory Visit: Payer: Self-pay | Admitting: Family Medicine

## 2011-09-04 ENCOUNTER — Encounter: Payer: Self-pay | Admitting: Family Medicine

## 2011-09-04 ENCOUNTER — Ambulatory Visit (INDEPENDENT_AMBULATORY_CARE_PROVIDER_SITE_OTHER): Payer: BC Managed Care – PPO | Admitting: Family Medicine

## 2011-09-04 VITALS — BP 104/72 | HR 87 | Temp 97.9°F | Resp 16 | Ht 60.0 in

## 2011-09-04 DIAGNOSIS — D649 Anemia, unspecified: Secondary | ICD-10-CM

## 2011-09-04 DIAGNOSIS — E559 Vitamin D deficiency, unspecified: Secondary | ICD-10-CM

## 2011-09-04 DIAGNOSIS — R7301 Impaired fasting glucose: Secondary | ICD-10-CM

## 2011-09-04 DIAGNOSIS — E663 Overweight: Secondary | ICD-10-CM

## 2011-09-04 DIAGNOSIS — R5381 Other malaise: Secondary | ICD-10-CM

## 2011-09-04 DIAGNOSIS — R7302 Impaired glucose tolerance (oral): Secondary | ICD-10-CM

## 2011-09-04 DIAGNOSIS — Z13220 Encounter for screening for lipoid disorders: Secondary | ICD-10-CM

## 2011-09-04 DIAGNOSIS — R5383 Other fatigue: Secondary | ICD-10-CM

## 2011-09-04 DIAGNOSIS — R220 Localized swelling, mass and lump, head: Secondary | ICD-10-CM | POA: Insufficient documentation

## 2011-09-04 DIAGNOSIS — R221 Localized swelling, mass and lump, neck: Secondary | ICD-10-CM

## 2011-09-04 DIAGNOSIS — E785 Hyperlipidemia, unspecified: Secondary | ICD-10-CM

## 2011-09-04 DIAGNOSIS — Z139 Encounter for screening, unspecified: Secondary | ICD-10-CM

## 2011-09-04 DIAGNOSIS — R7309 Other abnormal glucose: Secondary | ICD-10-CM

## 2011-09-04 NOTE — Patient Instructions (Addendum)
F/U in 1 year.  Fasting labs today, CBC, chem 7, lipid, tsh, HBa1c , Vit d  You are referred for a scan of your neck to evaluate the swelling   Pls log into "my fitness pal" this will help you to count your calories so that you can lose weight.  Weight loss of 1.5 pounds per month  Addendum: due to multiple lab abnormalities pt needs to return in  6 months with labs before visit, she is aware

## 2011-09-04 NOTE — Progress Notes (Signed)
  Subjective:    Patient ID: Sarah Lutz, female    DOB: Feb 04, 1971, 40 y.o.   MRN: [id number]  HPI 1 week h/o neck swelling concerned may be due to underlying cancer. Denies any associated pain or fever , no recent sore throat , sinus pressure or nasal drainage. Pt has not been exercising on the schedule discussed, her diet is not consistent and her weight is unchanged, she recognizes the need to make changes or improved health Has upcoming gynae  Exam and mammogram is also upcoming.    Review of Systems See HPI Denies recent fever or chills. Denies sinus pressure, nasal congestion, ear pain or sore throat. Denies chest congestion, productive cough or wheezing. Denies chest pains, palpitations and leg swelling Denies abdominal pain, nausea, vomiting,diarrhea or constipation.   Denies dysuria, frequency, hesitancy or incontinence. Denies joint pain, swelling and limitation in mobility. Denies headaches, seizures, numbness, or tingling. Denies depression, anxiety or insomnia. Denies skin break down or rash.        Objective:   Physical Exam Patient alert and oriented and in no cardiopulmonary distress.  HEENT: No facial asymmetry, EOMI, no sinus tenderness,  oropharynx pink and moist.  Neck supple non tender swelling on right side of neck anteriorly  Chest: Clear to auscultation bilaterally.  CVS: S1, S2 no murmurs, no S3.  ABD: Soft non tender. Bowel sounds normal.  Ext: No edema  MS: Adequate ROM spine, shoulders, hips and knees.  Skin: Intact, no ulcerations or rash noted.  Psych: Good eye contact, normal affect. Memory intact not anxious or depressed appearing.  CNS: CN 2-12 intact, power, tone and sensation normal throughout.        Assessment & Plan:

## 2011-09-05 ENCOUNTER — Ambulatory Visit: Payer: 59 | Admitting: Family Medicine

## 2011-09-05 ENCOUNTER — Ambulatory Visit (HOSPITAL_COMMUNITY)
Admission: RE | Admit: 2011-09-05 | Discharge: 2011-09-05 | Disposition: A | Discharge status: Home / Self Care | Payer: BC Managed Care – PPO | Source: Ambulatory Visit | Attending: Family Medicine | Admitting: Family Medicine

## 2011-09-05 DIAGNOSIS — R220 Localized swelling, mass and lump, head: Secondary | ICD-10-CM

## 2011-09-05 DIAGNOSIS — R221 Localized swelling, mass and lump, neck: Secondary | ICD-10-CM

## 2011-09-05 DIAGNOSIS — R599 Enlarged lymph nodes, unspecified: Secondary | ICD-10-CM | POA: Insufficient documentation

## 2011-09-06 ENCOUNTER — Ambulatory Visit (HOSPITAL_COMMUNITY): Payer: BC Managed Care – PPO

## 2011-09-06 LAB — BASIC METABOLIC PANEL
BUN: 13 mg/dL (ref 6–23)
CO2: 25 mEq/L (ref 19–32)
Calcium: 8.8 mg/dL (ref 8.4–10.5)
Chloride: 104 mEq/L (ref 96–112)
Creat: 0.86 mg/dL (ref 0.50–1.10)
Glucose, Bld: 89 mg/dL (ref 70–99)
Potassium: 4.2 mEq/L (ref 3.5–5.3)
Sodium: 138 mEq/L (ref 135–145)

## 2011-09-06 LAB — CBC WITH DIFFERENTIAL/PLATELET
Basophils Absolute: 0 10*3/uL (ref 0.0–0.1)
Basophils Relative: 0 % (ref 0–1)
Eosinophils Absolute: 0.1 10*3/uL (ref 0.0–0.7)
Eosinophils Relative: 1 % (ref 0–5)
HCT: 33 % — ABNORMAL LOW (ref 36.0–46.0)
Hemoglobin: 11.1 g/dL — ABNORMAL LOW (ref 12.0–15.0)
Lymphocytes Relative: 51 % — ABNORMAL HIGH (ref 12–46)
Lymphs Abs: 2.5 10*3/uL (ref 0.7–4.0)
MCH: 28.7 pg (ref 26.0–34.0)
MCHC: 33.6 g/dL (ref 30.0–36.0)
MCV: 85.3 fL (ref 78.0–100.0)
Monocytes Absolute: 0.3 10*3/uL (ref 0.1–1.0)
Monocytes Relative: 6 % (ref 3–12)
Neutro Abs: 2 10*3/uL (ref 1.7–7.7)
Neutrophils Relative %: 42 % — ABNORMAL LOW (ref 43–77)
Platelets: 229 10*3/uL (ref 150–400)
RBC: 3.87 MIL/uL (ref 3.87–5.11)
RDW: 14.2 % (ref 11.5–15.5)
WBC: 4.9 10*3/uL (ref 4.0–10.5)

## 2011-09-06 LAB — ANEMIA PANEL
%SAT: 16 % — ABNORMAL LOW (ref 20–55)
ABS Retic: 81.5 10*3/uL (ref 19.0–186.0)
Ferritin: 65 ng/mL (ref 10–291)
Folate: 15 ng/mL
Iron: 41 ug/dL — ABNORMAL LOW (ref 42–145)
RBC.: 3.88 MIL/uL (ref 3.87–5.11)
Retic Ct Pct: 2.1 % (ref 0.4–2.3)
TIBC: 252 ug/dL (ref 250–470)
UIBC: 211 ug/dL (ref 125–400)
Vitamin B-12: 307 pg/mL (ref 211–911)

## 2011-09-06 LAB — LIPID PANEL
Cholesterol: 200 mg/dL (ref 0–200)
HDL: 64 mg/dL (ref >39)
LDL Cholesterol: 128 mg/dL — ABNORMAL HIGH (ref 0–99)
Total CHOL/HDL Ratio: 3.1 Ratio
Triglycerides: 41 mg/dL (ref <150)
VLDL: 8 mg/dL (ref 0–40)

## 2011-09-06 LAB — HEMOGLOBIN A1C
Hgb A1c MFr Bld: 5.8 % — ABNORMAL HIGH (ref <5.7)
Mean Plasma Glucose: 120 mg/dL — ABNORMAL HIGH (ref <117)

## 2011-09-06 LAB — VITAMIN D 25 HYDROXY (VIT D DEFICIENCY, FRACTURES): Vit D, 25-Hydroxy: 12 ng/mL — ABNORMAL LOW (ref 30–89)

## 2011-09-06 LAB — TSH: TSH: 3.108 u[IU]/mL (ref 0.350–4.500)

## 2011-09-10 ENCOUNTER — Telehealth: Payer: Self-pay | Admitting: Family Medicine

## 2011-09-10 DIAGNOSIS — E785 Hyperlipidemia, unspecified: Secondary | ICD-10-CM

## 2011-09-10 DIAGNOSIS — R7302 Impaired glucose tolerance (oral): Secondary | ICD-10-CM | POA: Insufficient documentation

## 2011-09-10 DIAGNOSIS — D649 Anemia, unspecified: Secondary | ICD-10-CM

## 2011-09-10 DIAGNOSIS — E559 Vitamin D deficiency, unspecified: Secondary | ICD-10-CM | POA: Insufficient documentation

## 2011-09-10 MED ORDER — VITAMIN D3 1.25 MG (50000 UT) PO CAPS: 50000.0000 [IU] | ORAL_CAPSULE | ORAL | Status: DC

## 2011-09-10 MED ORDER — FERROUS SULFATE 324 (65 FE) MG PO TBEC: 1.0000 | DELAYED_RELEASE_TABLET | Freq: Every day | ORAL | Status: DC

## 2011-09-10 NOTE — Assessment & Plan Note (Signed)
Refer for [place] to evaluate same

## 2011-09-10 NOTE — Assessment & Plan Note (Signed)
Improved since 2008, bad cholesterol still too high, low fat diet info to be given, excellent HDL

## 2011-09-10 NOTE — Assessment & Plan Note (Signed)
Iron deficiency anemia, pt has been advised , will attempt top prescribe daily supplement, if insurance does not cover she has been advised to tyake 325mg  daily, rept lab in 6 months

## 2011-09-10 NOTE — Assessment & Plan Note (Signed)
12 pound weight gain in 2 years , needs to make lifestyle changes to reverse this

## 2011-09-10 NOTE — Assessment & Plan Note (Signed)
Pt aware of elevated HBa1C, dietary change advised will also recommend she attend diabetes grp class to further underrstand carb counting

## 2011-09-10 NOTE — Telephone Encounter (Signed)
I called pt and made her aware that she needs vit D, cbc and iron level ,fasting lipid,6 months, prior to her next visit , which she will need to reschedule for 6 months rather than 12 due to multiple lab abnormalities, I sent in scripts for vit D and iron.  Pls provide low fat diet info, also encourage her to register for grp prediabetic class to further understand carb counting , provide her with info from the office  also. Pls print and provide her with recent labs per her request

## 2011-09-11 NOTE — Telephone Encounter (Signed)
Pt collected this am

## 2011-09-11 NOTE — Telephone Encounter (Signed)
Addended by: Abner Greenspan on: 09/11/2011 08:24 AM   Modules accepted: Orders

## 2011-09-13 ENCOUNTER — Other Ambulatory Visit: Payer: Self-pay | Admitting: Adult Health

## 2011-09-13 ENCOUNTER — Other Ambulatory Visit (HOSPITAL_COMMUNITY)
Admission: RE | Admit: 2011-09-13 | Discharge: 2011-09-13 | Disposition: A | Discharge status: Home / Self Care | Payer: BC Managed Care – PPO | Source: Ambulatory Visit | Attending: Obstetrics and Gynecology | Admitting: Obstetrics and Gynecology

## 2011-09-13 DIAGNOSIS — Z01419 Encounter for gynecological examination (general) (routine) without abnormal findings: Secondary | ICD-10-CM | POA: Insufficient documentation

## 2012-03-11 ENCOUNTER — Ambulatory Visit: Payer: BC Managed Care – PPO | Admitting: Family Medicine

## 2012-04-08 ENCOUNTER — Other Ambulatory Visit: Payer: Self-pay | Admitting: Family Medicine

## 2012-04-08 DIAGNOSIS — Z1231 Encounter for screening mammogram for malignant neoplasm of breast: Secondary | ICD-10-CM

## 2012-05-14 ENCOUNTER — Ambulatory Visit
Admission: RE | Admit: 2012-05-14 | Discharge: 2012-05-14 | Disposition: A | Discharge status: Home / Self Care | Payer: BC Managed Care – PPO | Source: Ambulatory Visit | Attending: Family Medicine | Admitting: Family Medicine

## 2012-05-14 DIAGNOSIS — Z1231 Encounter for screening mammogram for malignant neoplasm of breast: Secondary | ICD-10-CM

## 2012-09-03 ENCOUNTER — Ambulatory Visit: Payer: 59 | Admitting: Family Medicine

## 2012-09-19 ENCOUNTER — Other Ambulatory Visit (HOSPITAL_COMMUNITY)
Admission: RE | Admit: 2012-09-19 | Discharge: 2012-09-19 | Disposition: A | Discharge status: Home / Self Care | Payer: BC Managed Care – PPO | Source: Ambulatory Visit | Attending: Obstetrics and Gynecology | Admitting: Obstetrics and Gynecology

## 2012-09-19 ENCOUNTER — Other Ambulatory Visit: Payer: Self-pay | Admitting: Adult Health

## 2012-09-19 DIAGNOSIS — Z1151 Encounter for screening for human papillomavirus (HPV): Secondary | ICD-10-CM | POA: Insufficient documentation

## 2012-09-19 DIAGNOSIS — Z01419 Encounter for gynecological examination (general) (routine) without abnormal findings: Secondary | ICD-10-CM | POA: Insufficient documentation

## 2012-09-25 ENCOUNTER — Encounter: Payer: BC Managed Care – PPO | Admitting: Family Medicine

## 2012-10-21 ENCOUNTER — Ambulatory Visit (INDEPENDENT_AMBULATORY_CARE_PROVIDER_SITE_OTHER): Payer: BC Managed Care – PPO | Admitting: Family Medicine

## 2012-10-21 ENCOUNTER — Encounter: Payer: Self-pay | Admitting: Family Medicine

## 2012-10-21 VITALS — BP 110/64 | HR 84 | Resp 18 | Ht 60.0 in | Wt 148.0 lb

## 2012-10-21 DIAGNOSIS — R7302 Impaired glucose tolerance (oral): Secondary | ICD-10-CM

## 2012-10-21 DIAGNOSIS — E785 Hyperlipidemia, unspecified: Secondary | ICD-10-CM

## 2012-10-21 DIAGNOSIS — R7309 Other abnormal glucose: Secondary | ICD-10-CM

## 2012-10-21 DIAGNOSIS — Z0000 Encounter for general adult medical examination without abnormal findings: Secondary | ICD-10-CM

## 2012-10-21 DIAGNOSIS — E663 Overweight: Secondary | ICD-10-CM

## 2012-10-21 NOTE — Progress Notes (Signed)
  Subjective:    Patient ID: Sarah Lutz, female    DOB: Apr 13, 1971, 42 y.o.   MRN: [id number]  HPI Pt is in for her required "annual check" as it relates to her job. She actually has her physical exam with her gynecologist. She only takes OCP Denies pain, anxiety, depression or sleep disturbance. Reports walking at least 3 miles per day on her job. Has tried to modify her diet, but needs to work more on this , has actually gainde weight since last visit, and has both prediabetes and dyslipidemia in her history Mammogram is up to date. Colonoscopy due next year. Consistently refuses the flu vaccine. Reports being up to date on the tetanus shot through her job   Review of Systems See HPI Denies recent fever or chills. Denies sinus pressure, nasal congestion, ear pain or sore throat. Denies chest congestion, productive cough or wheezing. Denies chest pains, palpitations and leg swelling Denies abdominal pain, nausea, vomiting,diarrhea or constipation.   Denies dysuria, frequency, hesitancy or incontinence. Denies joint pain, swelling and limitation in mobility. Denies headaches, seizures, numbness, or tingling. Denies depression, anxiety or insomnia. Denies skin break down or rash.        Objective:   Physical Exam  Patient alert and oriented and in no cardiopulmonary distress.  HEENT: No facial asymmetry, EOMI, no sinus tenderness,  oropharynx pink and moist.  Neck supple no adenopathy.  Chest: Clear to auscultation bilaterally.  CVS: S1, S2 no murmurs, no S3.  ABD: Soft non tender. Bowel sounds normal.  Ext: No edema  MS: Adequate ROM spine, shoulders, hips and knees.  Skin: Intact, no ulcerations or rash noted.  Psych: Good eye contact, normal affect. Memory intact not anxious or depressed appearing.  CNS: CN 2-12 intact, power, tone and sensation normal throughout.       Assessment & Plan:

## 2012-10-21 NOTE — Patient Instructions (Addendum)
F/U in 1 year, call if you need me.  You are healthy.  Please focus on continuing to walk for 3 miles daily  Please cut back on calories eaten so that you lose weight  Weight loss goal of 10 to 15 pounds in the next 52 weeks  Please get HBA1C drawn if not recently done , you will get a script, pls check with the nurse , non fasting

## 2012-10-22 ENCOUNTER — Encounter: Payer: BC Managed Care – PPO | Admitting: Family Medicine

## 2012-10-22 DIAGNOSIS — Z0000 Encounter for general adult medical examination without abnormal findings: Secondary | ICD-10-CM | POA: Insufficient documentation

## 2012-10-22 NOTE — Assessment & Plan Note (Signed)
Deteriorated. Patient re-educated about  the importance of commitment to a  minimum of 150 minutes of exercise per week. The importance of healthy food choices with portion control discussed. Encouraged to start a food diary, count calories and to consider  joining a support group. Sample diet sheets offered. Goals set by the patient for the next several months.    

## 2012-10-22 NOTE — Assessment & Plan Note (Signed)
Hyperlipidemia:Low fat diet discussed and encouraged.  Need to obtain recent labs for review, reportedly normal

## 2012-10-22 NOTE — Assessment & Plan Note (Signed)
Please advise the patient that their blood sugars are higher than normal.   A normal HbA1C is less than 5.7, please give them their value.  They need to cut back on carb's and sweets, start regular exercise, target at least 30 minutes 5 days a weekly and aim for weight loss.  This will reduce the risk of becoming diabetic or delayed development. Need to review most recent HBA1C

## 2012-10-22 NOTE — Assessment & Plan Note (Signed)
annual visit from a historical perspective completed as documented. Pt has physical exam at gyne. History is reviewed, healthy lifestyle discussed and encouraged with weight loss goal set primarily through dietary change as pt reports daily physical activity Encouraged again to take the flu vaccine, still refusing Recent labs to be obtained

## 2012-11-17 ENCOUNTER — Telehealth: Payer: Self-pay | Admitting: Family Medicine

## 2012-11-17 NOTE — Telephone Encounter (Signed)
Can you pls findd out exactly what code is needed for the visit. 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

## 2012-11-18 NOTE — Telephone Encounter (Signed)
Per Darl Pikes Large/Coding Mgr, patients October 28, 2012 physical was coded correctly and insurance has paid claim.  See no evidence that patient has been billed anything over the insurance reimbursement.

## 2012-11-19 NOTE — Telephone Encounter (Signed)
Called and left msg for patient as to findings.

## 2013-04-07 ENCOUNTER — Other Ambulatory Visit: Payer: Self-pay

## 2013-04-07 DIAGNOSIS — Z1231 Encounter for screening mammogram for malignant neoplasm of breast: Secondary | ICD-10-CM

## 2013-04-22 ENCOUNTER — Ambulatory Visit (INDEPENDENT_AMBULATORY_CARE_PROVIDER_SITE_OTHER): Payer: BC Managed Care – PPO | Admitting: Family Medicine

## 2013-04-22 ENCOUNTER — Encounter: Payer: Self-pay | Admitting: Family Medicine

## 2013-04-22 VITALS — BP 140/88 | HR 87 | Resp 16 | Ht 60.0 in | Wt 155.8 lb

## 2013-04-22 DIAGNOSIS — E663 Overweight: Secondary | ICD-10-CM

## 2013-04-22 DIAGNOSIS — M5380 Other specified dorsopathies, site unspecified: Secondary | ICD-10-CM

## 2013-04-22 DIAGNOSIS — M62830 Muscle spasm of back: Secondary | ICD-10-CM

## 2013-04-22 MED ORDER — KETOROLAC TROMETHAMINE 60 MG/2ML IM SOLN
60.0000 mg | Freq: Once | INTRAMUSCULAR | Status: AC
  Administered 2013-04-22: 60 mg via INTRAMUSCULAR

## 2013-04-22 MED ORDER — ESOMEPRAZOLE MAGNESIUM 40 MG PO CPDR: 40.0000 mg | DELAYED_RELEASE_CAPSULE | Freq: Every day | ORAL | Status: DC

## 2013-04-22 MED ORDER — TIZANIDINE HCL 4 MG PO CAPS: 4.0000 mg | ORAL_CAPSULE | Freq: Three times a day (TID) | ORAL | Status: DC

## 2013-04-22 MED ORDER — HYDROCODONE-ACETAMINOPHEN 5-300 MG PO TABS: ORAL_TABLET | ORAL | Status: AC

## 2013-04-22 MED ORDER — IBUPROFEN 800 MG PO TABS: 800.0000 mg | ORAL_TABLET | Freq: Three times a day (TID) | ORAL | Status: DC | PRN

## 2013-04-22 MED ORDER — TIZANIDINE HCL 4 MG PO CAPS: 4.0000 mg | ORAL_CAPSULE | Freq: Three times a day (TID) | ORAL | Status: AC

## 2013-04-22 NOTE — Progress Notes (Signed)
  Subjective:    Patient ID: Sarah Lutz, female    DOB: 28-Apr-1971, 42 y.o.   MRN: [id number]  HPI  Low back spasm since last night, no aggravating factor, first episode. Radiates to legs at times, no incontinence of stool or urine, no lower extremity weakness or numbness  Review of Systems See HPI Denies recent fever or chills. Denies sinus pressure, nasal congestion, ear pain or sore throat. Denies chest congestion, productive cough or wheezing. Denies chest pains, palpitations and leg swelling Denies abdominal pain, nausea, vomiting,diarrhea or constipation.   Denies dysuria, frequency, hesitancy or incontinence. Denies joint pain, swelling and does have current  limitation in mobility due to recurrent back spasm Denies headaches, seizures, numbness, or tingling.       Objective:   Physical Exam Patient alert and oriented and in no cardiopulmonary distress.Tearful due to pain, moving around bent over double at times due to spasm and pain  HEENT: No facial asymmetry, EOMI, no sinus tenderness,  oropharynx pink and moist.  Neck supple no adenopathy.  Chest: Clear to auscultation bilaterally.  CVS: S1, S2 no murmurs, no S3.  ABD: Soft non tender. Bowel sounds normal.  Ext: No edema  MS: decreased ROM spine,spasm of lumbar muscles   Skin: Intact,  Psych: Good eye contact, normal affect.  CNS: CN 2-12 intact, power, tone and sensation normal throughout.        Assessment & Plan:

## 2013-04-22 NOTE — Patient Instructions (Addendum)
F/u as before, call if you need me please  You have acute back spasm, you will get toradol 60mg  IM in the office, followed by ibuprofen and and a muscle relaxant  Take the ibuprofen as directed for at least 3 days, then as needed. Take the zanaflex (muscle relaxant ) as directed for at least 2 days then at bedtime only for an additional 3 days Take nexium one daily for 5 days for stomach protection while on high dose ibuprofen Hydrocodone sent in for uncontrolled pain  Work excuse from 7/30  To return  April 27, 2013  Vicodin is also prescribed for you to take one twice  Daily as needed, for uncontrolled pain  Back Pain, Adult Low back pain is very common. About 1 in 5 people have back pain.The cause of low back pain is rarely dangerous. The pain often gets better over time.About half of people with a sudden onset of back pain feel better in just 2 weeks. About 8 in 10 people feel better by 6 weeks.  CAUSES Some common causes of back pain include:  Strain of the muscles or ligaments supporting the spine.  Wear and tear (degeneration) of the spinal discs.  Arthritis.  Direct injury to the back. DIAGNOSIS Most of the time, the direct cause of low back pain is not known.However, back pain can be treated effectively even when the exact cause of the pain is unknown.Answering your caregiver's questions about your overall health and symptoms is one of the most accurate ways to make sure the cause of your pain is not dangerous. If your caregiver needs more information, he or she may order lab work or imaging tests (X-rays or MRIs).However, even if imaging tests show changes in your back, this usually does not require surgery. HOME CARE INSTRUCTIONS For many people, back pain returns.Since low back pain is rarely dangerous, it is often a condition that people can learn to [facility] their own.   Remain active. It is stressful on the back to sit or stand in one place. Do not sit, drive, or  stand in one place for more than 30 minutes at a time. Take short walks on level surfaces as soon as pain allows.Try to increase the length of time you walk each day.  Do not stay in bed.Resting more than 1 or 2 days can delay your recovery.  Do not avoid exercise or work.Your body is made to move.It is not dangerous to be active, even though your back may hurt.Your back will likely heal faster if you return to being active before your pain is gone.  Pay attention to your body when you bend and lift. Many people have less discomfortwhen lifting if they bend their knees, keep the load close to their bodies,and avoid twisting. Often, the most comfortable positions are those that put less stress on your recovering back.  Find a comfortable position to sleep. Use a firm mattress and lie on your side with your knees slightly bent. If you lie on your back, put a pillow under your knees.  Only take over-the-counter or prescription medicines as directed by your caregiver. Over-the-counter medicines to reduce pain and inflammation are often the most helpful.Your caregiver may prescribe muscle relaxant drugs.These medicines help dull your pain so you can more quickly return to your normal activities and healthy exercise.  Put ice on the injured area.  Put ice in a plastic bag.  Place a towel between your skin and the bag.  Leave the  ice on for 15-20 minutes, 3-4 times a day for the first 2 to 3 days. After that, ice and heat may be alternated to reduce pain and spasms.  Ask your caregiver about trying back exercises and gentle massage. This may be of some benefit.  Avoid feeling anxious or stressed.Stress increases muscle tension and can worsen back pain.It is important to recognize when you are anxious or stressed and learn ways to manage it.Exercise is a great option. SEEK MEDICAL CARE IF:  You have pain that is not relieved with rest or medicine.  You have pain that does not improve  in 1 week.  You have new symptoms.  You are generally not feeling well. SEEK IMMEDIATE MEDICAL CARE IF:   You have pain that radiates from your back into your legs.  You develop new bowel or bladder control problems.  You have unusual weakness or numbness in your arms or legs.  You develop nausea or vomiting.  You develop abdominal pain.  You feel faint. Document Released: 09/10/2005 Document Revised: 03/11/2012 Document Reviewed: 01/29/2011 [facility] Patient Information 2014 [place], [place].

## 2013-04-25 NOTE — Assessment & Plan Note (Signed)
Acute episode, first ever, aggressive anti inflammatory and muscle relaxant. Needs work excuse x 2 days

## 2013-04-25 NOTE — Assessment & Plan Note (Signed)
Deteriorated.  

## 2013-04-29 ENCOUNTER — Telehealth: Payer: Self-pay | Admitting: Family Medicine

## 2013-04-29 NOTE — Telephone Encounter (Signed)
Still having some back pain but nothing like it was. Still pretty uncomfortable and wanted to see if you want her to have an xray still before her meds run out

## 2013-04-30 ENCOUNTER — Other Ambulatory Visit: Payer: Self-pay | Admitting: Family Medicine

## 2013-04-30 DIAGNOSIS — M549 Dorsalgia, unspecified: Secondary | ICD-10-CM

## 2013-04-30 DIAGNOSIS — M545 Low back pain, unspecified: Secondary | ICD-10-CM

## 2013-04-30 NOTE — Telephone Encounter (Signed)
X rays of mid and low back oprdered pls let her know

## 2013-04-30 NOTE — Telephone Encounter (Signed)
Called and left detailed message on personal voicemail with instructions of where to go for xrays.

## 2013-05-11 ENCOUNTER — Telehealth: Payer: Self-pay | Admitting: Family Medicine

## 2013-05-11 NOTE — Telephone Encounter (Signed)
noted 

## 2013-05-20 ENCOUNTER — Ambulatory Visit
Admission: RE | Admit: 2013-05-20 | Discharge: 2013-05-20 | Disposition: A | Discharge status: Home / Self Care | Payer: BC Managed Care – PPO | Source: Ambulatory Visit

## 2013-05-20 DIAGNOSIS — Z1231 Encounter for screening mammogram for malignant neoplasm of breast: Secondary | ICD-10-CM

## 2013-07-29 ENCOUNTER — Telehealth: Payer: Self-pay

## 2013-07-30 ENCOUNTER — Telehealth: Payer: Self-pay | Admitting: Family Medicine

## 2013-07-30 NOTE — Telephone Encounter (Signed)
Patient states that she believes she is having a reaction to changing dish detergent.  Rash on hands and arms and she believes it is spreading to legs.  She is unable to take sulfa and prednisone due to reactions.  No shortness of breath.  She is taking Zantac and Benadryl.  And is scheduled to see an allergist in December.  Would like to know if there is anything further that can be done.

## 2013-07-30 NOTE — Telephone Encounter (Signed)
Both added to her allergy list

## 2013-07-30 NOTE — Telephone Encounter (Signed)
Called and left message for patient on cell voicemail

## 2013-07-30 NOTE — Telephone Encounter (Signed)
otc zyrtec one daily is also very helpful for allergic reactions.pls let her know I assume she has stopped the detergent also

## 2013-09-22 ENCOUNTER — Other Ambulatory Visit: Payer: Self-pay | Admitting: Adult Health

## 2013-09-25 ENCOUNTER — Other Ambulatory Visit: Payer: Self-pay | Admitting: Adult Health

## 2013-09-29 ENCOUNTER — Ambulatory Visit (INDEPENDENT_AMBULATORY_CARE_PROVIDER_SITE_OTHER): Payer: BC Managed Care – PPO | Admitting: Adult Health

## 2013-09-29 ENCOUNTER — Encounter: Payer: Self-pay | Admitting: Adult Health

## 2013-09-29 VITALS — BP 114/70 | HR 74 | Ht 60.0 in | Wt 151.0 lb

## 2013-09-29 DIAGNOSIS — Z01419 Encounter for gynecological examination (general) (routine) without abnormal findings: Secondary | ICD-10-CM

## 2013-09-29 DIAGNOSIS — Z1212 Encounter for screening for malignant neoplasm of rectum: Secondary | ICD-10-CM

## 2013-09-29 DIAGNOSIS — Z309 Encounter for contraceptive management, unspecified: Secondary | ICD-10-CM

## 2013-09-29 HISTORY — DX: Encounter for contraceptive management, unspecified: Z30.9

## 2013-09-29 LAB — HEMOCCULT GUIAC POC 1CARD (OFFICE): Fecal Occult Blood, POC: NEGATIVE

## 2013-09-29 MED ORDER — NORETHIN-ETH ESTRAD-FE BIPHAS 1 MG-10 MCG / 10 MCG PO TABS: ORAL_TABLET | ORAL | Status: DC

## 2013-09-29 NOTE — Patient Instructions (Signed)
Physical in 1 year Mammogram yearly Labs at work  

## 2013-09-29 NOTE — Progress Notes (Signed)
Patient ID: Sarah Lutz, female   DOB: 05/24/1971, 43 y.o.   MRN: [id number] History of Present Illness: Sarah LaundrySonya is a 43 year old black female in for a physical, she had a normal pap with HPV 09/19/12. She is happy with her OCs.She says she does not take the flu shot.  Current Medications, Allergies, Past Medical History, Past Surgical History, Family History and Social History were reviewed in [facility] electronic medical record.   Past Medical History  Diagnosis Date  . Contraceptive management 09/29/2013  History reviewed. No pertinent past surgical history. Current outpatient prescriptions:Norethindrone-Ethinyl Estradiol-Fe Biphas (LO LOESTRIN FE) 1 MG-10 MCG / 10 MCG tablet, TAKE ONE TABLET BY MOUTH EVERY DAY, Disp: 28 tablet, Rfl: 11  Review of Systems: Patient denies any headaches, blurred vision, shortness of breath, chest pain, abdominal pain, problems with bowel movements, urination, or intercourse. No joint pain or mood swings.   Physical Exam:BP 114/70  Pulse 74  Ht 5' (1.524 m)  Wt 151 lb (68.493 kg)  BMI 29.49 kg/m2 General:  Well developed, well nourished, no acute distress Skin:  Warm and dry Neck:  Midline trachea, normal thyroid Lungs; Clear to auscultation bilaterally Breast:  No dominant palpable mass, retraction, or nipple discharge Cardiovascular: Regular rate and rhythm Abdomen:  Soft, non tender, no hepatosplenomegaly Pelvic:  External genitalia is normal in appearance.  The vagina is normal in appearance. The cervix is smooth.  Uterus is felt to be normal size, shape, and contour.  No  adnexal masses or tenderness noted. Rectal: Good sphincter tone, no polyps, or hemorrhoids felt.  Hemoccult negative. Extremities:  No swelling or varicosities noted Psych:  No mood changes, alert and cooperative, seems happy still works at [employer].  Impression: Yearly gyn exam no pap Contraceptive management  Plan: Physical in 1 year Mammogram yearly Labs at  work Refilled Lo loestrin x 1 year

## 2013-09-30 ENCOUNTER — Telehealth: Payer: Self-pay | Admitting: Adult Health

## 2013-09-30 ENCOUNTER — Encounter: Payer: Self-pay | Admitting: Adult Health

## 2013-09-30 NOTE — Telephone Encounter (Signed)
Had clarification about history, will try to correct

## 2014-01-06 ENCOUNTER — Other Ambulatory Visit (HOSPITAL_COMMUNITY): Payer: Self-pay | Admitting: Nurse Practitioner

## 2014-01-06 ENCOUNTER — Ambulatory Visit (HOSPITAL_COMMUNITY)
Admission: RE | Admit: 2014-01-06 | Discharge: 2014-01-06 | Disposition: A | Discharge status: Home / Self Care | Payer: BC Managed Care – PPO | Source: Ambulatory Visit | Attending: Nurse Practitioner | Admitting: Nurse Practitioner

## 2014-01-06 DIAGNOSIS — R0989 Other specified symptoms and signs involving the circulatory and respiratory systems: Secondary | ICD-10-CM

## 2014-01-06 DIAGNOSIS — I498 Other specified cardiac arrhythmias: Secondary | ICD-10-CM | POA: Insufficient documentation

## 2014-04-21 ENCOUNTER — Other Ambulatory Visit: Payer: Self-pay

## 2014-04-21 DIAGNOSIS — Z1231 Encounter for screening mammogram for malignant neoplasm of breast: Secondary | ICD-10-CM

## 2014-05-27 ENCOUNTER — Ambulatory Visit
Admission: RE | Admit: 2014-05-27 | Discharge: 2014-05-27 | Disposition: A | Discharge status: Home / Self Care | Payer: BC Managed Care – PPO | Source: Ambulatory Visit

## 2014-05-27 DIAGNOSIS — Z1231 Encounter for screening mammogram for malignant neoplasm of breast: Secondary | ICD-10-CM

## 2014-09-22 ENCOUNTER — Other Ambulatory Visit: Payer: Self-pay | Admitting: Adult Health

## 2014-09-30 ENCOUNTER — Encounter: Payer: Self-pay | Admitting: Adult Health

## 2014-09-30 ENCOUNTER — Ambulatory Visit (INDEPENDENT_AMBULATORY_CARE_PROVIDER_SITE_OTHER): Payer: BLUE CROSS/BLUE SHIELD | Admitting: Adult Health

## 2014-09-30 VITALS — BP 116/72 | HR 74 | Ht 59.5 in | Wt 155.0 lb

## 2014-09-30 DIAGNOSIS — Z01419 Encounter for gynecological examination (general) (routine) without abnormal findings: Secondary | ICD-10-CM

## 2014-09-30 DIAGNOSIS — Z1212 Encounter for screening for malignant neoplasm of rectum: Secondary | ICD-10-CM

## 2014-09-30 DIAGNOSIS — Z3041 Encounter for surveillance of contraceptive pills: Secondary | ICD-10-CM

## 2014-09-30 LAB — HEMOCCULT GUIAC POC 1CARD (OFFICE): Fecal Occult Blood, POC: NEGATIVE

## 2014-09-30 MED ORDER — NORETHIN-ETH ESTRAD-FE BIPHAS 1 MG-10 MCG / 10 MCG PO TABS: 1.0000 | ORAL_TABLET | Freq: Every day | ORAL | Status: DC

## 2014-09-30 NOTE — Patient Instructions (Signed)
Pap and physical in 1 year Mammogram yearly  Labs at work  Colonoscopy advised at 7150

## 2014-09-30 NOTE — Progress Notes (Signed)
Patient ID: [name] N Kelso, female   DOB: [birth year], 44 y.o.   MRN: [id number] History of Present Illness: Lamar LaundrySonya is a  44 year old black female in for gyn exam.She had a normal pap with negative HPV.   Current Medications, Allergies, Past Medical History, Past Surgical History, Family History and Social History were reviewed in [facility] electronic medical record.     Review of Systems: Patient denies any headaches, blurred vision, shortness of breath, chest pain, abdominal pain, problems with bowel movements, urination, or intercourse. No joint pain or mood swings.Happy with her pills, no periods.    Physical Exam:BP 116/72 mmHg  Pulse 74  Ht 4' 11.5" (1.511 m)  Wt 155 lb (70.308 kg)  BMI 30.79 kg/m2 General:  Well developed, well nourished, no acute distress Skin:  Warm and dry Neck:  Midline trachea, normal thyroid Lungs; Clear to auscultation bilaterally Breast:  No dominant palpable mass, retraction, or nipple discharge Cardiovascular: Regular rate and rhythm Abdomen:  Soft, non tender, no hepatosplenomegaly Pelvic:  External genitalia is normal in appearance.  The vagina is normal in appearance. The cervix is smooth.  Uterus is felt to be normal size, shape, and contour.  No adnexal masses or tenderness noted. Rectal: Good sphincter tone, no polyps, or hemorrhoids felt.  Hemoccult negative. Extremities:  No swelling or varicosities noted Psych:  No mood changes,alert and cooperative, seems happy   Impression: Well woman gyn exam no pap Contraceptive management    Plan: Refilled lo loestrin x 1 year Pap and physical in 1 year Mammogram yearly  Colonoscopy at 48  Labs at work Try whole 30 for weight loss

## 2015-04-25 ENCOUNTER — Other Ambulatory Visit: Payer: Self-pay

## 2015-04-25 DIAGNOSIS — Z1231 Encounter for screening mammogram for malignant neoplasm of breast: Secondary | ICD-10-CM

## 2015-05-31 ENCOUNTER — Ambulatory Visit: Payer: Self-pay

## 2015-06-01 ENCOUNTER — Ambulatory Visit
Admission: RE | Admit: 2015-06-01 | Discharge: 2015-06-01 | Disposition: A | Discharge status: Home / Self Care | Payer: BLUE CROSS/BLUE SHIELD | Source: Ambulatory Visit

## 2015-06-01 DIAGNOSIS — Z1231 Encounter for screening mammogram for malignant neoplasm of breast: Secondary | ICD-10-CM

## 2015-10-12 ENCOUNTER — Encounter: Payer: Self-pay | Admitting: Adult Health

## 2015-10-12 ENCOUNTER — Ambulatory Visit (INDEPENDENT_AMBULATORY_CARE_PROVIDER_SITE_OTHER): Payer: BLUE CROSS/BLUE SHIELD | Admitting: Adult Health

## 2015-10-12 ENCOUNTER — Other Ambulatory Visit (HOSPITAL_COMMUNITY)
Admission: RE | Admit: 2015-10-12 | Discharge: 2015-10-12 | Disposition: A | Discharge status: Home / Self Care | Payer: BLUE CROSS/BLUE SHIELD | Source: Ambulatory Visit | Attending: Adult Health | Admitting: Adult Health

## 2015-10-12 VITALS — BP 118/62 | HR 78 | Ht 59.5 in | Wt 145.5 lb

## 2015-10-12 DIAGNOSIS — Z1212 Encounter for screening for malignant neoplasm of rectum: Secondary | ICD-10-CM | POA: Diagnosis not present

## 2015-10-12 DIAGNOSIS — Z3041 Encounter for surveillance of contraceptive pills: Secondary | ICD-10-CM

## 2015-10-12 DIAGNOSIS — Z1151 Encounter for screening for human papillomavirus (HPV): Secondary | ICD-10-CM | POA: Insufficient documentation

## 2015-10-12 DIAGNOSIS — Z01419 Encounter for gynecological examination (general) (routine) without abnormal findings: Secondary | ICD-10-CM | POA: Diagnosis present

## 2015-10-12 DIAGNOSIS — Z0000 Encounter for general adult medical examination without abnormal findings: Secondary | ICD-10-CM | POA: Diagnosis not present

## 2015-10-12 LAB — HEMOCCULT GUIAC POC 1CARD (OFFICE): Fecal Occult Blood, POC: NEGATIVE

## 2015-10-12 MED ORDER — NORETHIN-ETH ESTRAD-FE BIPHAS 1 MG-10 MCG / 10 MCG PO TABS: 1.0000 | ORAL_TABLET | Freq: Every day | ORAL | Status: DC

## 2015-10-12 NOTE — Patient Instructions (Signed)
Physical in 1 year, pap in 3 if normal Mammogram yearly Labs at work F/U with Dr Lodema Hong

## 2015-10-12 NOTE — Progress Notes (Signed)
Patient ID: Sarah Lutz, female   DOB: [birth year], 45 y.o.   MRN: [id number] History of Present Illness: Sarah Lutz is a 45 year old black female in for a well woman gyn exam and pap, and get lo loestrin refilled, no complaints.Had normal mammogram 06/01/15.Declines flu shot. PCP is Dr Lodema Hong.   Current Medications, Allergies, Past Medical History, Past Surgical History, Family History and Social History were reviewed in [facility] record.     Review of Systems:  Patient denies any headaches, hearing loss, fatigue, blurred vision, shortness of breath, chest pain, abdominal pain, problems with bowel movements, urination, or intercourse. No joint pain or mood swings.  Physical Exam:BP 118/62 mmHg  Pulse 78  Ht 4' 11.5" (1.511 m)  Wt 145 lb 8 oz (65.998 kg)  BMI 28.91 kg/m2 General:  Well developed, well nourished, no acute distress Skin:  Warm and dry Neck:  Midline trachea, normal thyroid, good ROM, no lymphadenopathy Lungs; Clear to auscultation bilaterally Breast:  No dominant palpable mass, retraction, or nipple discharge Cardiovascular: Regular rate and rhythm Abdomen:  Soft, non tender, no hepatosplenomegaly Pelvic:  External genitalia is normal in appearance, no lesions.  The vagina is normal in appearance. Urethra has no lesions or masses. The cervix is smooth, pap with HPV performed.  Uterus is felt to be normal size, shape, and contour.  No adnexal masses or tenderness noted.Bladder is non tender, no masses felt. Rectal: Good sphincter tone, no polyps, or hemorrhoids felt.  Hemoccult negative. Extremities/musculoskeletal:  No swelling or varicosities noted, no clubbing or cyanosis Psych:  No mood changes, alert and cooperative,seems happy   Impression: Well woman gyn exam and pap Contraceptive management    Plan: Refilled lo loestrin x 1 year Mammogram yearly Labs at work  Physical in 1 year, pap in 3 if normal  Has appt in March with Dr Lodema Hong

## 2015-10-14 LAB — CYTOLOGY - PAP

## 2015-12-01 ENCOUNTER — Encounter: Payer: BLUE CROSS/BLUE SHIELD | Admitting: Family Medicine

## 2015-12-22 ENCOUNTER — Telehealth: Payer: Self-pay | Admitting: Family Medicine

## 2015-12-22 NOTE — Telephone Encounter (Signed)
Called and left message for patient to return call.  

## 2015-12-22 NOTE — Telephone Encounter (Signed)
Patient is complaining of a cough for 2 wks denies fever, has tried otc medications but nothing has helped, she is asking if Dr. Lodema HongSimpson will call her in something, please advise?

## 2015-12-26 NOTE — Telephone Encounter (Signed)
The nurse at work called her in something so she is fine now

## 2016-01-30 ENCOUNTER — Encounter: Payer: BLUE CROSS/BLUE SHIELD | Admitting: Family Medicine

## 2016-03-12 ENCOUNTER — Telehealth: Payer: Self-pay | Admitting: Adult Health

## 2016-03-12 ENCOUNTER — Telehealth: Payer: Self-pay | Admitting: Family Medicine

## 2016-03-12 NOTE — Telephone Encounter (Signed)
Left message x 1. JSY 

## 2016-03-12 NOTE — Telephone Encounter (Signed)
Opened In Error

## 2016-03-13 NOTE — Telephone Encounter (Signed)
Spoke with pt. Pt listened to voicemail from pharmacy and it said pt has until 6/26 to pick up prescription. Pt to get med from pharmacy. JSY

## 2016-03-20 ENCOUNTER — Encounter: Payer: Self-pay | Admitting: Family Medicine

## 2016-03-20 ENCOUNTER — Ambulatory Visit (INDEPENDENT_AMBULATORY_CARE_PROVIDER_SITE_OTHER): Payer: BLUE CROSS/BLUE SHIELD | Admitting: Family Medicine

## 2016-03-20 VITALS — BP 146/88 | HR 76 | Resp 18 | Ht 60.0 in | Wt 136.0 lb

## 2016-03-20 DIAGNOSIS — D649 Anemia, unspecified: Secondary | ICD-10-CM

## 2016-03-20 DIAGNOSIS — E785 Hyperlipidemia, unspecified: Secondary | ICD-10-CM | POA: Diagnosis not present

## 2016-03-20 DIAGNOSIS — Z0000 Encounter for general adult medical examination without abnormal findings: Secondary | ICD-10-CM

## 2016-03-20 DIAGNOSIS — M25561 Pain in right knee: Secondary | ICD-10-CM

## 2016-03-20 DIAGNOSIS — Z114 Encounter for screening for human immunodeficiency virus [HIV]: Secondary | ICD-10-CM

## 2016-03-20 DIAGNOSIS — E663 Overweight: Secondary | ICD-10-CM

## 2016-03-20 DIAGNOSIS — I10 Essential (primary) hypertension: Secondary | ICD-10-CM | POA: Diagnosis not present

## 2016-03-20 DIAGNOSIS — R7302 Impaired glucose tolerance (oral): Secondary | ICD-10-CM

## 2016-03-20 DIAGNOSIS — E559 Vitamin D deficiency, unspecified: Secondary | ICD-10-CM | POA: Diagnosis not present

## 2016-03-20 DIAGNOSIS — M25562 Pain in left knee: Secondary | ICD-10-CM

## 2016-03-20 MED ORDER — AMLODIPINE BESYLATE 2.5 MG PO TABS: 2.5000 mg | ORAL_TABLET | Freq: Every day | ORAL | Status: DC

## 2016-03-20 NOTE — Patient Instructions (Addendum)
F/u in 3 month, call if you need me sooner  Fasting labs this week'  New for elevated blood pressure is once daily amlodipine   TdAP at work this week  Congrats on weight loss  ,Thanks for choosing [facility], we consider it a privelige to serve you.    You are referred to Dr Romeo AppleHarrison for bilateral knee pain and instability

## 2016-03-25 DIAGNOSIS — M25562 Pain in left knee: Secondary | ICD-10-CM

## 2016-03-25 DIAGNOSIS — M25561 Pain in right knee: Secondary | ICD-10-CM | POA: Insufficient documentation

## 2016-03-25 NOTE — Assessment & Plan Note (Signed)

## 2016-03-25 NOTE — Assessment & Plan Note (Signed)
Hyperlipidemia:Low fat diet discussed and encouraged.   Lipid Panel  Lab Results  Component Value Date   CHOL 200 09/04/2011   HDL 64 09/04/2011   LDLCALC 128* 09/04/2011   TRIG 41 09/04/2011   CHOLHDL 3.1 09/04/2011   Updated lab needed at/ before next visit.

## 2016-03-25 NOTE — Assessment & Plan Note (Signed)
Bilateral knee apin, right worse than left , with instability intermittently, refer ortho for further eval and managenment

## 2016-03-25 NOTE — Assessment & Plan Note (Signed)
Improved Patient re-educated about  the importance of commitment to a  minimum of 150 minutes of exercise per week.  The importance of healthy food choices with portion control discussed. Encouraged to start a food diary, count calories and to consider  joining a support group. Sample diet sheets offered. Goals set by the patient for the next several months.   Weight /BMI 03/20/2016 10/12/2015 09/30/2014  WEIGHT 136 lb 145 lb 8 oz 155 lb  HEIGHT 5\' 0"  4' 11.5" 4' 11.5"  BMI 26.56 kg/m2 28.91 kg/m2 30.79 kg/m2

## 2016-03-25 NOTE — Assessment & Plan Note (Signed)
Updated lab needed at/ before next visit.   

## 2016-03-25 NOTE — Assessment & Plan Note (Signed)
Uncontrolled, pt needs to start medication. Amlodipine 2.5 mg daily prescribed DASH diet and commitment to daily physical activity for a minimum of 30 minutes discussed and encouraged, as a part of hypertension management. The importance of attaining a healthy weight is also discussed.  BP/Weight 03/20/2016 10/12/2015 09/30/2014 09/29/2013 04/22/2013 10/21/2012 09/04/2011  Systolic BP 146 118 116 114 140 110 104  Diastolic BP 88 62 72 70 88 64 72  Wt. (Lbs) 136 145.5 155 151 155.8 148 -  BMI 26.56 28.91 30.79 29.49 30.43 28.9 -

## 2016-03-25 NOTE — Progress Notes (Signed)
Subjective:    Patient ID: Sarah Lutz, female    DOB: 11-03-1970, 45 y.o.   MRN: [id number]  HPI Patient is in for annual physical exam. C/o bilateral knee pain , right greater than left with intermittent instability  Immunization is reviewed , needs TdAP and states she will get this through her job, where she can get it at no cost to her Has been focusing on good health habits, improved weight. Up to date with well woman exam and mammogram    Review of Systems See HPI Denies recent fever or chills. Denies sinus pressure, nasal congestion, ear pain or sore throat. Denies chest congestion, productive cough or wheezing. Denies chest pains, palpitations and leg swelling Denies abdominal pain, nausea, vomiting,diarrhea or constipation.   Denies dysuria, frequency, hesitancy or incontinence.  Denies headaches, seizures, numbness, or tingling. Denies depression, anxiety or insomnia. Denies skin break down or rash.        Objective:   Physical Exam  BP 146/88 mmHg  Pulse 76  Resp 18  Ht 5' (1.524 m)  Wt 136 lb (61.689 kg)  BMI 26.56 kg/m2  SpO2 100% Patient alert and oriented and in no cardiopulmonary distress.  HEENT: No facial asymmetry, EOMI,   oropharynx pink and moist.  Neck supple no JVD, no mass.  Chest: Clear to auscultation bilaterally.  CVS: S1, S2 no murmurs, no S3.Regular rate.  ABD: Soft non tender.   Ext: No edema  MS: Adequate ROM spine, shoulders, hips and reduced in  Knees right greater than left with crepitus  Skin: Intact, no ulcerations or rash noted.  Psych: Good eye contact, normal affect. Memory intact not anxious or depressed appearing.  CNS: CN 2-12 intact, power,  normal throughout.no focal deficits noted.       Assessment & Plan:  Routine general medical examination at a health care facility Annual exam as documented. Counseling done  re healthy lifestyle involving commitment to 150 minutes exercise per week, heart healthy  diet, and attaining healthy weight.The importance of adequate sleep also discussed. Regular seat belt use and home safety, is also discussed. Changes in health habits are decided on by the patient with goals and time frames  set for achieving them. Immunization and cancer screening needs are specifically addressed at this visit.   Essential hypertension Uncontrolled, pt needs to start medication. Amlodipine 2.5 mg daily prescribed DASH diet and commitment to daily physical activity for a minimum of 30 minutes discussed and encouraged, as a part of hypertension management. The importance of attaining a healthy weight is also discussed.  BP/Weight 03/20/2016 10/12/2015 09/30/2014 09/29/2013 04/22/2013 10/21/2012 09/04/2011  Systolic BP 146 118 116 114 140 110 104  Diastolic BP 88 62 72 70 88 64 72  Wt. (Lbs) 136 145.5 155 151 155.8 148 -  BMI 26.56 28.91 30.79 29.49 30.43 28.9 -        Arthralgia of both knees Bilateral knee apin, right worse than left , with instability intermittently, refer ortho for further eval and managenment  IGT (impaired glucose tolerance) Updated lab needed at/ before next visit.   Hyperlipidemia Hyperlipidemia:Low fat diet discussed and encouraged.   Lipid Panel  Lab Results  Component Value Date   CHOL 200 09/04/2011   HDL 64 09/04/2011   LDLCALC 128* 09/04/2011   TRIG 41 09/04/2011   CHOLHDL 3.1 09/04/2011   Updated lab needed at/ before next visit.      Overweight Improved Patient re-educated about  the importance of commitment  to a  minimum of 150 minutes of exercise per week.  The importance of healthy food choices with portion control discussed. Encouraged to start a food diary, count calories and to consider  joining a support group. Sample diet sheets offered. Goals set by the patient for the next several months.   Weight /BMI 03/20/2016 10/12/2015 09/30/2014  WEIGHT 136 lb 145 lb 8 oz 155 lb  HEIGHT 5\' 0"  4' 11.5" 4' 11.5"  BMI 26.56  kg/m2 28.91 kg/m2 30.79 kg/m2

## 2016-03-26 ENCOUNTER — Telehealth: Payer: Self-pay | Admitting: Family Medicine

## 2016-03-26 NOTE — Telephone Encounter (Signed)
Please  advise vit D is low, and erx Vit D3 50,000 IU once weekly #4 refill 5, and let pt know .  Otherwise excellent labs

## 2016-03-28 NOTE — Telephone Encounter (Signed)
Called and left message for patient to return call.  

## 2016-03-29 MED ORDER — VITAMIN D (ERGOCALCIFEROL) 1.25 MG (50000 UNIT) PO CAPS: 50000.0000 [IU] | ORAL_CAPSULE | ORAL | Status: DC

## 2016-03-29 NOTE — Addendum Note (Signed)
Addended by: Kandis FantasiaSLADE, Kainoah Bartosiewicz B on: 03/29/2016 09:41 AM   Modules accepted: Orders

## 2016-03-29 NOTE — Telephone Encounter (Signed)
Patient aware Rx sent to pharmacy. °

## 2016-04-06 ENCOUNTER — Telehealth: Payer: Self-pay | Admitting: *Deleted

## 2016-04-06 ENCOUNTER — Telehealth: Payer: Self-pay | Admitting: Adult Health

## 2016-04-06 NOTE — Telephone Encounter (Signed)
Pt was given a coupon card for Lo Loestrin and she put info in and got a response that she didn't qualify. I called Amy Davis, drug rep for Lo Lo and had to leave a message. Pt was given 2 samples of Lo Lo. Lot # [id number] A exp 3/18. JSY

## 2016-04-06 NOTE — Telephone Encounter (Signed)
Spoke with pt. Pt is on Lo Loestrin and cost has gone up. I told pt to come by office and get a coupon card. Pt to pick up today. JSY

## 2016-04-09 NOTE — Telephone Encounter (Signed)
I spoke with Stephan MinisterAmy Davis, drug rep for Lo Loestrin and she stated pt should qualify for discount on Lo Loestrin. Pt was advised to call the number on coupon card. Pt voiced understanding. JSY

## 2016-04-27 ENCOUNTER — Other Ambulatory Visit: Payer: Self-pay | Admitting: Family Medicine

## 2016-04-27 DIAGNOSIS — Z1231 Encounter for screening mammogram for malignant neoplasm of breast: Secondary | ICD-10-CM

## 2016-06-04 ENCOUNTER — Ambulatory Visit
Admission: RE | Admit: 2016-06-04 | Discharge: 2016-06-04 | Disposition: A | Discharge status: Home / Self Care | Payer: BLUE CROSS/BLUE SHIELD | Source: Ambulatory Visit | Attending: Family Medicine | Admitting: Family Medicine

## 2016-06-04 DIAGNOSIS — Z1231 Encounter for screening mammogram for malignant neoplasm of breast: Secondary | ICD-10-CM

## 2016-06-26 ENCOUNTER — Ambulatory Visit: Payer: BLUE CROSS/BLUE SHIELD | Admitting: Family Medicine

## 2016-07-11 ENCOUNTER — Ambulatory Visit (INDEPENDENT_AMBULATORY_CARE_PROVIDER_SITE_OTHER): Payer: BLUE CROSS/BLUE SHIELD | Admitting: Family Medicine

## 2016-07-11 ENCOUNTER — Encounter: Payer: Self-pay | Admitting: Family Medicine

## 2016-07-11 VITALS — BP 118/80 | HR 72 | Ht 60.0 in | Wt 145.1 lb

## 2016-07-11 DIAGNOSIS — M25561 Pain in right knee: Secondary | ICD-10-CM | POA: Diagnosis not present

## 2016-07-11 DIAGNOSIS — E663 Overweight: Secondary | ICD-10-CM

## 2016-07-11 DIAGNOSIS — Z23 Encounter for immunization: Secondary | ICD-10-CM

## 2016-07-11 DIAGNOSIS — I10 Essential (primary) hypertension: Secondary | ICD-10-CM | POA: Diagnosis not present

## 2016-07-11 DIAGNOSIS — M25562 Pain in left knee: Secondary | ICD-10-CM

## 2016-07-11 DIAGNOSIS — E559 Vitamin D deficiency, unspecified: Secondary | ICD-10-CM

## 2016-07-11 MED ORDER — ERGOCALCIFEROL 1.25 MG (50000 UT) PO CAPS: 50000.0000 [IU] | ORAL_CAPSULE | ORAL | 1 refills | Status: DC

## 2016-07-11 NOTE — Patient Instructions (Addendum)
Annual physical exam June29 or after  Please commit to medication as prescribed  Please learn to slow down  and take deep breaths  It is important that you exercise regularly at least 30 minutes 5 times a week. If you develop chest pain, have severe difficulty breathing, or feel very tired, stop exercising immediately and seek medical attention    Please work on good  health habits so that your health will improve. 1. Commitment to daily physical activity for 30 to 60  minutes, if you are able to do this.  2. Commitment to wise food choices. Aim for half of your  food intake to be vegetable and fruit, one quarter starchy foods, and one quarter protein. Try to eat on a regular schedule  3 meals per day, snacking between meals should be limited to vegetables or fruits or small portions of nuts. 64 ounces of water per day is generally recommended, unless you have specific health conditions, like heart failure or kidney failure where you will need to limit fluid intake.  3. Commitment to sufficient and a  good quality of physical and mental rest daily, generally between 6 to 8 hours per day.  WITH PERSISTANCE AND PERSEVERANCE, THE IMPOSSIBLE , BECOMES THE NORM!

## 2016-07-13 ENCOUNTER — Encounter: Payer: Self-pay | Admitting: Family Medicine

## 2016-07-13 NOTE — Assessment & Plan Note (Signed)
Controlled, no change in medication DASH diet and commitment to daily physical activity for a minimum of 30 minutes discussed and encouraged, as a part of hypertension management. The importance of attaining a healthy weight is also discussed.  BP/Weight 07/11/2016 03/20/2016 10/12/2015 09/30/2014 09/29/2013 04/22/2013 10/21/2012  Systolic BP 118 146 118 116 114 140 110  Diastolic BP 80 88 62 72 70 88 64  Wt. (Lbs) 145.12 136 145.5 155 151 155.8 148  BMI 28.34 26.56 28.91 30.79 29.49 30.43 28.9

## 2016-07-13 NOTE — Assessment & Plan Note (Signed)
Recent orthopedic eval, no significant pathology

## 2016-07-13 NOTE — Assessment & Plan Note (Signed)
Continue supplement. 

## 2016-07-13 NOTE — Assessment & Plan Note (Signed)
Deteriorated. Patient re-educated about  the importance of commitment to a  minimum of 150 minutes of exercise per week.  The importance of healthy food choices with portion control discussed. Encouraged to start a food diary, count calories and to consider  joining a support group. Sample diet sheets offered. Goals set by the patient for the next several months.   Weight /BMI 07/11/2016 03/20/2016 10/12/2015  WEIGHT 145 lb 1.9 oz 136 lb 145 lb 8 oz  HEIGHT 5\' 0"  5\' 0"  4' 11.5"  BMI 28.34 kg/m2 26.56 kg/m2 28.91 kg/m2

## 2016-07-13 NOTE — Progress Notes (Signed)
   [name] N Corbitt     MRN: [id number]      DOB: 08/27/1971   HPI Ms. Sarah BusmanDalton is here for follow up and re-evaluation of chronic medical conditions, medication management and review of any available recent lab and radiology data.  Preventive health is updated, specifically  Cancer screening and Immunization.   Questions or concerns regarding consultations or procedures which the PT has had in the interim are  Addressed.Saw orthopedics re knee pain, no significant pathology The PT denies any adverse reactions to current medications since the last visit.  States recently swelling of right side of neck was noted and labs were ordered through her job Takes BP med on average 5 of 7 days, "does not really feel that she needs it"  ROS Denies recent fever or chills. Denies sinus pressure, nasal congestion, ear pain or sore throat. Denies chest congestion, productive cough or wheezing. Denies chest pains, palpitations and leg swelling Denies abdominal pain, nausea, vomiting,diarrhea or constipation.   Denies dysuria, frequency, hesitancy or incontinence. Denies uncontrolled  joint pain, swelling and limitation in mobility. Denies headaches, seizures, numbness, or tingling. Denies depression, anxiety or insomnia. Denies skin break down or rash.   PE  BP 118/80   Pulse 72   Ht 5' (1.524 m)   Wt 145 lb 1.9 oz (65.8 kg)   SpO2 99%   BMI 28.34 kg/m   Patient alert and oriented and in no cardiopulmonary distress.  HEENT: No facial asymmetry, EOMI,   oropharynx pink and moist.  Neck supple no JVD, no mass.  Chest: Clear to auscultation bilaterally.  CVS: S1, S2 no murmurs, no S3.Regular rate.  ABD: Soft non tender.   Ext: No edema  MS: Adequate ROM spine, shoulders, hips and knees.  Skin: Intact, no ulcerations or rash noted.  Psych: Good eye contact, normal affect. Memory intact not anxious or depressed appearing.  CNS: CN 2-12 intact, power,  normal throughout.no focal deficits  noted.   Assessment & Plan  Essential hypertension Controlled, no change in medication DASH diet and commitment to daily physical activity for a minimum of 30 minutes discussed and encouraged, as a part of hypertension management. The importance of attaining a healthy weight is also discussed.  BP/Weight 07/11/2016 03/20/2016 10/12/2015 09/30/2014 09/29/2013 04/22/2013 10/21/2012  Systolic BP 118 146 118 116 114 140 110  Diastolic BP 80 88 62 72 70 88 64  Wt. (Lbs) 145.12 136 145.5 155 151 155.8 148  BMI 28.34 26.56 28.91 30.79 29.49 30.43 28.9       Arthralgia of both knees Recent orthopedic eval, no significant pathology  Overweight Deteriorated. Patient re-educated about  the importance of commitment to a  minimum of 150 minutes of exercise per week.  The importance of healthy food choices with portion control discussed. Encouraged to start a food diary, count calories and to consider  joining a support group. Sample diet sheets offered. Goals set by the patient for the next several months.   Weight /BMI 07/11/2016 03/20/2016 10/12/2015  WEIGHT 145 lb 1.9 oz 136 lb 145 lb 8 oz  HEIGHT 5\' 0"  5\' 0"  4' 11.5"  BMI 28.34 kg/m2 26.56 kg/m2 28.91 kg/m2      Vitamin D deficiency Continue supplement

## 2016-10-01 ENCOUNTER — Other Ambulatory Visit: Payer: Self-pay | Admitting: Family Medicine

## 2016-10-01 DIAGNOSIS — I10 Essential (primary) hypertension: Secondary | ICD-10-CM

## 2016-10-15 ENCOUNTER — Encounter: Payer: Self-pay | Admitting: Adult Health

## 2016-10-15 ENCOUNTER — Other Ambulatory Visit (HOSPITAL_COMMUNITY)
Admission: RE | Admit: 2016-10-15 | Discharge: 2016-10-15 | Disposition: A | Discharge status: Home / Self Care | Payer: BLUE CROSS/BLUE SHIELD | Source: Ambulatory Visit | Attending: Adult Health | Admitting: Adult Health

## 2016-10-15 ENCOUNTER — Ambulatory Visit (INDEPENDENT_AMBULATORY_CARE_PROVIDER_SITE_OTHER): Payer: BLUE CROSS/BLUE SHIELD | Admitting: Adult Health

## 2016-10-15 VITALS — BP 133/71 | HR 77 | Ht 59.5 in | Wt 151.5 lb

## 2016-10-15 DIAGNOSIS — Z1212 Encounter for screening for malignant neoplasm of rectum: Secondary | ICD-10-CM | POA: Diagnosis not present

## 2016-10-15 DIAGNOSIS — Z01419 Encounter for gynecological examination (general) (routine) without abnormal findings: Secondary | ICD-10-CM | POA: Diagnosis present

## 2016-10-15 DIAGNOSIS — Z3041 Encounter for surveillance of contraceptive pills: Secondary | ICD-10-CM | POA: Insufficient documentation

## 2016-10-15 DIAGNOSIS — Z124 Encounter for screening for malignant neoplasm of cervix: Secondary | ICD-10-CM

## 2016-10-15 DIAGNOSIS — Z1151 Encounter for screening for human papillomavirus (HPV): Secondary | ICD-10-CM | POA: Insufficient documentation

## 2016-10-15 DIAGNOSIS — Z1211 Encounter for screening for malignant neoplasm of colon: Secondary | ICD-10-CM

## 2016-10-15 LAB — HEMOCCULT GUIAC POC 1CARD (OFFICE): Fecal Occult Blood, POC: NEGATIVE

## 2016-10-15 MED ORDER — NORETHIN-ETH ESTRAD-FE BIPHAS 1 MG-10 MCG / 10 MCG PO TABS: 1.0000 | ORAL_TABLET | Freq: Every day | ORAL | 4 refills | Status: DC

## 2016-10-15 NOTE — Progress Notes (Signed)
Subjective:     Patient ID: [name] N Lutz, female   DOB: 1970/10/12, 46 y.o.   MRN: [id number]  HPI Lamar LaundrySonya is a 46 year old black female in for a pap smear, she had labs and physical with Dr Lodema HongSimpson. No complaints and she is happy with her pills, not having period.  Review of Systems Patient denies any headaches, hearing loss, fatigue, blurred vision, shortness of breath, chest pain, abdominal pain, problems with bowel movements, urination, or intercourse. No joint pain or mood swings. Reviewed past medical,surgical, social and family history. Reviewed medications and allergies.     Objective:   Physical Exam BP 133/71 (BP Location: Left Arm, Patient Position: Sitting, Cuff Size: Normal)   Pulse 77   Ht 4' 11.5" (1.511 m)   Wt 151 lb 8 oz (68.7 kg)   BMI 30.09 kg/m  Skin warm and dry.Pelvic: external genitalia is normal in appearance no lesions, vagina: is pink with good color,mositure and rugae,urethra has no lesions or masses noted, cervix:smooth and bulbous,pap with HPV performed, uterus: normal size, shape and contour, non tender, no masses felt, adnexa: no masses or tenderness noted. Bladder is non tender and no masses felt. On rectal exam has good tone, no hemorrhoids and hemoccult is negative. PHQ 2 score 0.    Assessment:     1. Encounter for gynecological examination with Papanicolaou smear of cervix   2. Encounter for surveillance of contraceptive pills   3. Screening for colorectal cancer       Plan:     Meds ordered this encounter  Medications  . Norethindrone-Ethinyl Estradiol-Fe Biphas (LO LOESTRIN FE) 1 MG-10 MCG / 10 MCG tablet    Sig: Take 1 tablet by mouth daily.    Dispense:  84 tablet    Refill:  4    Order Specific Question:   Supervising Provider    Answer:   Lazaro ArmsEURE, LUTHER H [[id number]]     Physical in 1 year Pap in 3 if normal Mammogram yearly Colonoscopy at  48 Labs with PCP

## 2016-10-15 NOTE — Patient Instructions (Signed)
Physical in 1 year Pap in 3 if normal Mammogram yearly Colonoscopy at  48 Labs with PCP

## 2016-10-17 LAB — CYTOLOGY - PAP
Adequacy: ABSENT
Diagnosis: NEGATIVE
HPV: NOT DETECTED

## 2017-03-28 ENCOUNTER — Encounter: Payer: BLUE CROSS/BLUE SHIELD | Admitting: Family Medicine

## 2017-05-06 ENCOUNTER — Other Ambulatory Visit: Payer: Self-pay | Admitting: Family Medicine

## 2017-05-06 DIAGNOSIS — Z1231 Encounter for screening mammogram for malignant neoplasm of breast: Secondary | ICD-10-CM

## 2017-05-15 ENCOUNTER — Encounter: Payer: BLUE CROSS/BLUE SHIELD | Admitting: Family Medicine

## 2017-06-05 ENCOUNTER — Ambulatory Visit
Admission: RE | Admit: 2017-06-05 | Discharge: 2017-06-05 | Disposition: A | Discharge status: Home / Self Care | Payer: Commercial Managed Care - PPO | Source: Ambulatory Visit | Attending: Family Medicine | Admitting: Family Medicine

## 2017-06-05 DIAGNOSIS — Z1231 Encounter for screening mammogram for malignant neoplasm of breast: Secondary | ICD-10-CM

## 2017-10-17 ENCOUNTER — Encounter: Payer: Self-pay | Admitting: Adult Health

## 2017-10-17 ENCOUNTER — Ambulatory Visit (INDEPENDENT_AMBULATORY_CARE_PROVIDER_SITE_OTHER): Payer: Commercial Managed Care - PPO | Admitting: Adult Health

## 2017-10-17 VITALS — BP 114/60 | HR 78 | Resp 15 | Ht 59.5 in | Wt 150.0 lb

## 2017-10-17 DIAGNOSIS — Z1211 Encounter for screening for malignant neoplasm of colon: Secondary | ICD-10-CM

## 2017-10-17 DIAGNOSIS — Z1212 Encounter for screening for malignant neoplasm of rectum: Secondary | ICD-10-CM

## 2017-10-17 DIAGNOSIS — Z3041 Encounter for surveillance of contraceptive pills: Secondary | ICD-10-CM

## 2017-10-17 DIAGNOSIS — Z01419 Encounter for gynecological examination (general) (routine) without abnormal findings: Secondary | ICD-10-CM | POA: Diagnosis not present

## 2017-10-17 LAB — HEMOCCULT GUIAC POC 1CARD (OFFICE): Fecal Occult Blood, POC: NEGATIVE

## 2017-10-17 MED ORDER — NORETHIN-ETH ESTRAD-FE BIPHAS 1 MG-10 MCG / 10 MCG PO TABS: 1.0000 | ORAL_TABLET | Freq: Every day | ORAL | 4 refills | Status: DC

## 2017-10-17 NOTE — Progress Notes (Signed)
Patient ID: [name] N Wake, female   DOB: 08/04/1971, 47 y.o.   MRN: [id number] History of Present Illness: Lamar LaundrySonya is a 47 year old black female in for well woman gyn exam,she had normal pap with negative HPV 10/18/16. PCP is Dr Lodema HongSimpson.    Current Medications, Allergies, Past Medical History, Past Surgical History, Family History and Social History were reviewed in [facility] electronic medical record.     Review of Systems: Patient denies any headaches, hearing loss, fatigue, blurred vision, shortness of breath, chest pain, abdominal pain, problems with bowel movements, urination, or intercourse. No joint pain or mood swings. No periods on lo loestrin.    Physical Exam:BP 114/60 (BP Location: Left Arm, Patient Position: Sitting, Cuff Size: Normal)   Pulse 78   Resp 15   Ht 4' 11.5" (1.511 m)   Wt 150 lb (68 kg)   BMI 29.79 kg/m  General:  Well developed, well nourished, no acute distress Skin:  Warm and dry Neck:  Midline trachea, normal thyroid, good ROM, no lymphadenopathy Lungs; Clear to auscultation bilaterally Breast:  No dominant palpable mass, retraction, or nipple discharge Cardiovascular: Regular rate and rhythm Abdomen:  Soft, non tender, no hepatosplenomegaly Pelvic:  External genitalia is normal in appearance, no lesions.  The vagina is normal in appearance. Urethra has no lesions or masses. The cervix is smooth.  Uterus is felt to be normal size, shape, and contour.  No adnexal masses or tenderness noted.Bladder is non tender, no masses felt. Rectal: Good sphincter tone, no polyps, or hemorrhoids felt.  Hemoccult negative. Extremities/musculoskeletal:  No swelling or varicosities noted, no clubbing or cyanosis Psych:  No mood changes, alert and cooperative,seems happy PHQ 2 score 0.   Impression:  1. Encounter for well woman exam with routine gynecological exam   2. Encounter for surveillance of contraceptive pills   3. Screening for colorectal cancer       Plan:  Meds ordered this encounter  Medications  . Norethindrone-Ethinyl Estradiol-Fe Biphas (LO LOESTRIN FE) 1 MG-10 MCG / 10 MCG tablet    Sig: Take 1 tablet by mouth daily.    Dispense:  84 tablet    Refill:  4    Order Specific Question:   Supervising Provider    Answer:   Lazaro ArmsEURE, LUTHER H [[id number]]  Physical in 1 year Pap in 2021 Mammogram yearly Labs at work Colonoscopy at 48 F/U with Dr Lodema HongSimpson on BP meds

## 2017-11-27 ENCOUNTER — Encounter: Payer: Self-pay | Admitting: Family Medicine

## 2017-11-27 ENCOUNTER — Ambulatory Visit (INDEPENDENT_AMBULATORY_CARE_PROVIDER_SITE_OTHER): Payer: Commercial Managed Care - PPO | Admitting: Family Medicine

## 2017-11-27 VITALS — BP 130/78 | HR 82 | Resp 16 | Ht 60.0 in | Wt 150.0 lb

## 2017-11-27 DIAGNOSIS — E663 Overweight: Secondary | ICD-10-CM | POA: Diagnosis not present

## 2017-11-27 DIAGNOSIS — I10 Essential (primary) hypertension: Secondary | ICD-10-CM

## 2017-11-27 MED ORDER — CLONIDINE HCL 0.1 MG PO TABS: ORAL_TABLET | ORAL | 3 refills | Status: DC

## 2017-11-27 NOTE — Patient Instructions (Addendum)
F/U in 6  Months, call if you need me before  Please change food choice and  Commit to daily exercise for 30 minutes   New for  Blood pressure is half clonidien one at bedtime  When you get anxious , please learn to walk away and take deep breaths, then come back  It is important that you exercise regularly at least 30 minutes 5 times a week. If you develop chest pain, have severe difficulty breathing, or feel very tired, stop exercising immediately and seek medical attention

## 2017-12-01 ENCOUNTER — Encounter: Payer: Self-pay | Admitting: Family Medicine

## 2017-12-01 NOTE — Assessment & Plan Note (Signed)
Needs to resume medication but will start a different medication due to hair loss with norvasc DASH diet and commitment to daily physical activity for a minimum of 30 minutes discussed and encouraged, as a part of hypertension management. The importance of attaining a healthy weight is also discussed.  BP/Weight 11/27/2017 10/17/2017 10/15/2016 07/11/2016 03/20/2016 10/12/2015 09/30/2014  Systolic BP 130 114 133 118 146 118 116  Diastolic BP 78 60 71 80 88 62 72  Wt. (Lbs) 150 150 151.5 145.12 136 145.5 155  BMI 29.29 29.79 30.09 28.34 26.56 28.91 30.79     F/u in 6 months

## 2017-12-01 NOTE — Progress Notes (Signed)
   [name] N Strout     MRN: [id number]      DOB: 1971-02-26   HPI [name] is here for follow up and re-evaluation of chronic medical conditions, medication management, states that her recent labs on the job were good and that she will drop them off  Preventive health is updated, specifically  Cancer screening and Immunization.   She is not taking her antihypertensive regularly, had stopped due to hair loss , however approx 1 week ago, when she was angry, she got a BP of 150/100, so this has sparked interest in starting medication management again.  No regular exercise commitment however walks for at least 1 hour on her job 5 days per week Does not feel she can lose weight Stressed raising her nieces 3 children   ROS Denies recent fever or chills. Denies sinus pressure, nasal congestion, ear pain or sore throat. Denies chest congestion, productive cough or wheezing. Denies chest pains, palpitations and leg swelling Denies abdominal pain, nausea, vomiting,diarrhea or constipation.   Denies dysuria, frequency, hesitancy or incontinence. Denies joint pain, swelling and limitation in mobility. Denies headaches, seizures, numbness, or tingling. Denies depression,  or insomnia. Denies skin break down or rash.   PE  BP 130/78   Pulse 82   Resp 16   Ht 5' (1.524 m)   Wt 150 lb (68 kg)   SpO2 98%   BMI 29.29 kg/m   Patient alert and oriented and in no cardiopulmonary distress.  HEENT: No facial asymmetry, EOMI,   oropharynx pink and moist.  Neck supple no JVD, no mass.  Chest: Clear to auscultation bilaterally.  CVS: S1, S2 no murmurs, no S3.Regular rate.  ABD: Soft non tender.   Ext: No edema  MS: Adequate ROM spine, shoulders, hips and knees.  Skin: Intact, no ulcerations or rash noted.  Psych: Good eye contact, normal affect. Memory intact not anxious or depressed appearing.  CNS: CN 2-12 intact, power,  normal throughout.no focal deficits noted.   Assessment &  Plan  Essential hypertension Needs to resume medication but will start a different medication due to hair loss with norvasc DASH diet and commitment to daily physical activity for a minimum of 30 minutes discussed and encouraged, as a part of hypertension management. The importance of attaining a healthy weight is also discussed.  BP/Weight 11/27/2017 10/17/2017 10/15/2016 07/11/2016 03/20/2016 10/12/2015 09/30/2014  Systolic BP 130 114 133 118 146 118 116  Diastolic BP 78 60 71 80 88 62 72  Wt. (Lbs) 150 150 151.5 145.12 136 145.5 155  BMI 29.29 29.79 30.09 28.34 26.56 28.91 30.79     F/u in 6 months  Overweight Unchanged Patient re-educated about  the importance of commitment to a  minimum of 150 minutes of exercise per week.  The importance of healthy food choices with portion control discussed. Encouraged to start a food diary, count calories and to consider  joining a support group. Sample diet sheets offered. Goals set by the patient for the next several months.   Weight /BMI 11/27/2017 10/17/2017 10/15/2016  WEIGHT 150 lb 150 lb 151 lb 8 oz  HEIGHT 5\' 0"  4' 11.5" 4' 11.5"  BMI 29.29 kg/m2 29.79 kg/m2 30.09 kg/m2

## 2017-12-01 NOTE — Assessment & Plan Note (Signed)
Unchanged Patient re-educated about  the importance of commitment to a  minimum of 150 minutes of exercise per week.  The importance of healthy food choices with portion control discussed. Encouraged to start a food diary, count calories and to consider  joining a support group. Sample diet sheets offered. Goals set by the patient for the next several months.   Weight /BMI 11/27/2017 10/17/2017 10/15/2016  WEIGHT 150 lb 150 lb 151 lb 8 oz  HEIGHT 5\' 0"  4' 11.5" 4' 11.5"  BMI 29.29 kg/m2 29.79 kg/m2 30.09 kg/m2

## 2018-02-18 ENCOUNTER — Encounter: Payer: Self-pay | Admitting: Family Medicine

## 2018-02-19 ENCOUNTER — Telehealth: Payer: Self-pay | Admitting: Family Medicine

## 2018-02-19 ENCOUNTER — Encounter (INDEPENDENT_AMBULATORY_CARE_PROVIDER_SITE_OTHER): Payer: Self-pay

## 2018-02-19 NOTE — Telephone Encounter (Signed)
Pls see her "My chat message. Pls sched an appt with me in the next 4 weeks re her blood pressure.  You may try to do this via My chart and allow a direct response to you if t you cannot get her on the phone

## 2018-02-24 NOTE — Telephone Encounter (Signed)
appt scheduled for 7-3

## 2018-03-11 ENCOUNTER — Ambulatory Visit: Payer: Commercial Managed Care - PPO | Admitting: Family Medicine

## 2018-03-26 ENCOUNTER — Ambulatory Visit: Payer: Commercial Managed Care - PPO | Admitting: Family Medicine

## 2018-03-26 ENCOUNTER — Other Ambulatory Visit: Payer: Self-pay

## 2018-03-26 ENCOUNTER — Ambulatory Visit (INDEPENDENT_AMBULATORY_CARE_PROVIDER_SITE_OTHER): Payer: Commercial Managed Care - PPO | Admitting: Family Medicine

## 2018-03-26 ENCOUNTER — Encounter: Payer: Self-pay | Admitting: Family Medicine

## 2018-03-26 VITALS — BP 114/76 | HR 65 | Resp 14 | Ht 60.0 in | Wt 155.0 lb

## 2018-03-26 DIAGNOSIS — E559 Vitamin D deficiency, unspecified: Secondary | ICD-10-CM

## 2018-03-26 DIAGNOSIS — R7302 Impaired glucose tolerance (oral): Secondary | ICD-10-CM

## 2018-03-26 DIAGNOSIS — Z6830 Body mass index (BMI) 30.0-30.9, adult: Secondary | ICD-10-CM

## 2018-03-26 DIAGNOSIS — I10 Essential (primary) hypertension: Secondary | ICD-10-CM | POA: Diagnosis not present

## 2018-03-26 DIAGNOSIS — E785 Hyperlipidemia, unspecified: Secondary | ICD-10-CM

## 2018-03-26 DIAGNOSIS — M25561 Pain in right knee: Secondary | ICD-10-CM

## 2018-03-26 DIAGNOSIS — E6609 Other obesity due to excess calories: Secondary | ICD-10-CM

## 2018-03-26 DIAGNOSIS — M25562 Pain in left knee: Secondary | ICD-10-CM

## 2018-03-26 MED ORDER — CLONIDINE HCL 0.1 MG PO TABS: ORAL_TABLET | ORAL | 3 refills | Status: DC

## 2018-03-26 NOTE — Assessment & Plan Note (Signed)
Deteriorated. Patient re-educated about  the importance of commitment to a  minimum of 150 minutes of exercise per week.  The importance of healthy food choices with portion control discussed. Encouraged to start a food diary, count calories and to consider  joining a support group. Sample diet sheets offered. Goals set by the patient for the next several months.   Weight /BMI 03/26/2018 11/27/2017 10/17/2017  WEIGHT 155 lb 0.6 oz 150 lb 150 lb  HEIGHT 5\' 0"  5\' 0"  4' 11.5"  BMI 30.28 kg/m2 29.29 kg/m2 29.79 kg/m2

## 2018-03-26 NOTE — Progress Notes (Signed)
Sarah Lutz     MRN: [id number]      DOB: November 23, 1970   HPI Sarah Lutz is here for follow up and re-evaluation of chronic medical conditions, medication management and review of any available recent lab and radiology data.  Preventive health is updated, specifically  Cancer screening and Immunization.   Main concern is her blood [pressure management. She takes her blood pressure twice daily and ovaries her medication based on numbers she gets.  Averages 1.5 clonidine daily. Has her log with her. Recently scared because of 2 deaths in the community of apparently healthy young men one of whom died because of untreated blood pressure her age She tolerates her medication well Exercise/ physical activity is good. Eating habits are unchanged and weight is increased. Motivation to change eating is absent unfortunately. She will get labs through her job this week The PT denies any adverse reactions to current medications since the last visit.    ROS Denies recent fever or chills. Denies sinus pressure, nasal congestion, ear pain or sore throat. Denies chest congestion, productive cough or wheezing. Denies chest pains, palpitations and leg swelling Denies abdominal pain, nausea, vomiting,diarrhea or constipation.   Denies dysuria, frequency, hesitancy or incontinence. Denies joint pain, swelling and limitation in mobility. Denies headaches, seizures, numbness, or tingling. Denies depression, anxiety or insomnia. Denies skin break down or rash.   PE  BP 114/76 (BP Location: Left Arm, Patient Position: Sitting, Cuff Size: Normal)   Pulse 65   Ht 5' (1.524 m)   Wt 155 lb 0.6 oz (70.3 kg)   BMI 30.28 kg/m   Patient alert and oriented and in no cardiopulmonary distress.  HEENT: No facial asymmetry, EOMI,   oropharynx pink and moist.  Neck supple no JVD, no mass.  Chest: Clear to auscultation bilaterally.  CVS: S1, S2 no murmurs, no S3.Regular rate.  ABD: Soft non tender.    Ext: No edema  MS: Adequate ROM spine, shoulders, hips and knees.  Skin: Intact, no ulcerations or rash noted.  Psych: Good eye contact, normal affect. Memory intact not anxious or depressed appearing.  CNS: CN 2-12 intact, power,  normal throughout.no focal deficits noted.   Assessment & Plan  Obese Deteriorated. Patient re-educated about  the importance of commitment to a  minimum of 150 minutes of exercise per week.  The importance of healthy food choices with portion control discussed. Encouraged to start a food diary, count calories and to consider  joining a support group. Sample diet sheets offered. Goals set by the patient for the next several months.   Weight /BMI 03/26/2018 11/27/2017 10/17/2017  WEIGHT 155 lb 0.6 oz 150 lb 150 lb  HEIGHT 5\' 0"  5\' 0"  4' 11.5"  BMI 30.28 kg/m2 29.29 kg/m2 29.79 kg/m2      Essential hypertension Controlled, no change in medication  Clonidine 0.1 mg one tablet in the morning and half tablet in the evening taken 12 hours apart  DASH diet and commitment to daily physical activity for a minimum of 30 minutes discussed and encouraged, as a part of hypertension management. The importance of attaining a healthy weight is also discussed.  BP/Weight 03/26/2018 11/27/2017 10/17/2017 10/15/2016 07/11/2016 03/20/2016 10/12/2015  Systolic BP 114 130 114 133 118 146 118  Diastolic BP 76 78 60 71 80 88 62  Wt. (Lbs) 155.04 150 150 151.5 145.12 136 145.5  BMI 30.28 29.29 29.79 30.09 28.34 26.56 28.91       Hyperlipidemia Updated lab needed at/  before next visit. Low fat diet discussed, will obtain labs this week through her job an forward them for my review  IGT (impaired glucose tolerance) Discussed the importance of reducing sugar and concentrated sweet in he die as well as starchy foods and increasing vegetables and fresh / frozen fruit, she understands that water is the only healthy drink  Arthralgia of both knees Advised weight loss to  reduce incidence of knee pain/ ache

## 2018-03-26 NOTE — Patient Instructions (Addendum)
F/U in January call if you need me before  Dose increase in medication , excellent blood pressure  Labs this week CBC, fasting lipid, cmp and EGFR, HBA1C, TSH, vit D`  It is important that you exercise regularly at least 30 minutes 5 times a week. If you develop chest pain, have severe difficulty breathing, or feel very tired, stop exercising immediately and seek medical attention    Please work on good  health habits so that your health will improve. 1. Commitment to daily physical activity for 30 to 60  minutes, if you are able to do this.  2. Commitment to wise food choices. Aim for half of your  food intake to be vegetable and fruit, one quarter starchy foods, and one quarter protein. Try to eat on a regular schedule  3 meals per day, snacking between meals should be limited to vegetables or fruits or small portions of nuts. 64 ounces of water per day is generally recommended, unless you have specific health conditions, like heart failure or kidney failure where you will need to limit fluid intake.  3. Commitment to sufficient and a  good quality of physical and mental rest daily, generally between 6 to 8 hours per day.  WITH PERSISTANCE AND PERSEVERANCE, THE IMPOSSIBLE , BECOMES THE NORM!

## 2018-03-28 ENCOUNTER — Encounter: Payer: Self-pay | Admitting: Family Medicine

## 2018-03-28 NOTE — Assessment & Plan Note (Signed)
Advised weight loss to reduce incidence of knee pain/ ache

## 2018-03-28 NOTE — Assessment & Plan Note (Addendum)
Controlled, no change in medication  Clonidine 0.1 mg one tablet in the morning and half tablet in the evening taken 12 hours apart  DASH diet and commitment to daily physical activity for a minimum of 30 minutes discussed and encouraged, as a part of hypertension management. The importance of attaining a healthy weight is also discussed.  BP/Weight 03/26/2018 11/27/2017 10/17/2017 10/15/2016 07/11/2016 03/20/2016 10/12/2015  Systolic BP 114 130 114 133 118 146 118  Diastolic BP 76 78 60 71 80 88 62  Wt. (Lbs) 155.04 150 150 151.5 145.12 136 145.5  BMI 30.28 29.29 29.79 30.09 28.34 26.56 28.91

## 2018-03-28 NOTE — Assessment & Plan Note (Signed)
Updated lab needed at/ before next visit. Low fat diet discussed, will obtain labs this week through her job an forward them for my review

## 2018-03-28 NOTE — Assessment & Plan Note (Signed)
Discussed the importance of reducing sugar and concentrated sweet in he die as well as starchy foods and increasing vegetables and fresh / frozen fruit, she understands that water is the only healthy drink

## 2018-05-05 ENCOUNTER — Other Ambulatory Visit: Payer: Self-pay | Admitting: Family Medicine

## 2018-05-05 DIAGNOSIS — Z1231 Encounter for screening mammogram for malignant neoplasm of breast: Secondary | ICD-10-CM

## 2018-06-03 ENCOUNTER — Ambulatory Visit: Payer: Commercial Managed Care - PPO | Admitting: Family Medicine

## 2018-06-10 ENCOUNTER — Encounter: Payer: Self-pay | Admitting: Family Medicine

## 2018-06-10 ENCOUNTER — Ambulatory Visit
Admission: RE | Admit: 2018-06-10 | Discharge: 2018-06-10 | Disposition: A | Discharge status: Home / Self Care | Payer: Commercial Managed Care - PPO | Source: Ambulatory Visit | Attending: Family Medicine | Admitting: Family Medicine

## 2018-06-10 DIAGNOSIS — Z1231 Encounter for screening mammogram for malignant neoplasm of breast: Secondary | ICD-10-CM

## 2018-09-23 ENCOUNTER — Ambulatory Visit: Payer: Commercial Managed Care - PPO | Admitting: Family Medicine

## 2018-09-29 ENCOUNTER — Ambulatory Visit (INDEPENDENT_AMBULATORY_CARE_PROVIDER_SITE_OTHER): Payer: Commercial Managed Care - PPO | Admitting: Family Medicine

## 2018-09-29 ENCOUNTER — Encounter: Payer: Self-pay | Admitting: Family Medicine

## 2018-09-29 VITALS — BP 110/70 | HR 62 | Resp 14 | Ht 59.0 in | Wt 156.0 lb

## 2018-09-29 DIAGNOSIS — I10 Essential (primary) hypertension: Secondary | ICD-10-CM | POA: Diagnosis not present

## 2018-09-29 DIAGNOSIS — R7302 Impaired glucose tolerance (oral): Secondary | ICD-10-CM

## 2018-09-29 DIAGNOSIS — R7301 Impaired fasting glucose: Secondary | ICD-10-CM

## 2018-09-29 DIAGNOSIS — E6609 Other obesity due to excess calories: Secondary | ICD-10-CM

## 2018-09-29 DIAGNOSIS — E785 Hyperlipidemia, unspecified: Secondary | ICD-10-CM | POA: Diagnosis not present

## 2018-09-29 DIAGNOSIS — Z1211 Encounter for screening for malignant neoplasm of colon: Secondary | ICD-10-CM

## 2018-09-29 DIAGNOSIS — Z6830 Body mass index (BMI) 30.0-30.9, adult: Secondary | ICD-10-CM

## 2018-09-29 MED ORDER — CLONIDINE HCL 0.1 MG PO TABS: ORAL_TABLET | ORAL | 3 refills | Status: DC

## 2018-09-29 NOTE — Patient Instructions (Addendum)
F/U in 6 months, call if you need me before  Fasting  Lipid, cmp and EGFr and HJBA1C this Friday, please fax with updated weight , I will send result via my Chart    NEED to adopt MINDFUL/ CONTROLLED and enjoy the food, colored, and natural, fruit, vegetable  BP med as discussed, 1 at 9am and 2 tablets at 9 pm  Weight loss goal of pounds  It is important that you exercise regularly at least 30 minutes 5 times a week. If you develop chest pain, have severe difficulty breathing, or feel very tired, stop exercising immediately and seek medical attention

## 2018-09-30 ENCOUNTER — Encounter: Payer: Self-pay | Admitting: Family Medicine

## 2018-09-30 NOTE — Assessment & Plan Note (Signed)
Controlled, no change in medication DASH diet and commitment to daily physical activity for a minimum of 30 minutes discussed and encouraged, as a part of hypertension management. The importance of attaining a healthy weight is also discussed.  BP/Weight 09/29/2018 03/26/2018 11/27/2017 10/17/2017 10/15/2016 07/11/2016 03/20/2016  Systolic BP 110 114 130 114 133 118 146  Diastolic BP 70 76 78 60 71 80 88  Wt. (Lbs) 156 155.04 150 150 151.5 145.12 136  BMI 31.51 30.28 29.29 29.79 30.09 28.34 26.56

## 2018-09-30 NOTE — Assessment & Plan Note (Signed)
Discussed importance of lowering sugar intake, and stopping sweet tea, so that she does not become diabetic, remains resistant to change Updated lab needed this week

## 2018-09-30 NOTE — Assessment & Plan Note (Signed)
Low fat diet discussed and encouraged, updated lab this week

## 2018-09-30 NOTE — Assessment & Plan Note (Signed)
Deteriorated. Patient re-educated about  the importance of commitment to a  minimum of 150 minutes of exercise per week.  The importance of healthy food choices with portion control discussed. Encouraged to start a food diary, count calories and to consider  joining a support group. Sample diet sheets offered. Goals set by the patient for the next several months.   Weight /BMI 09/29/2018 03/26/2018 11/27/2017  WEIGHT 156 lb 155 lb 0.6 oz 150 lb  HEIGHT 4\' 11"  5\' 0"  5\' 0"   BMI 31.51 kg/m2 30.28 kg/m2 29.29 kg/m2

## 2018-09-30 NOTE — Progress Notes (Signed)
Sarah Lutz     MRN: [id number]      DOB: October 04, 1970   HPI Ms. Raynolds is here for follow up and re-evaluation of chronic medical conditions, medication management and review of any available recent lab and radiology data.  Preventive health is updated, specifically  Cancer screening and Immunization.   Refuses flu vaccine Questions or concerns regarding consultations or procedures which the PT has had in the interim are  addressed. The PT denies any adverse reactions to current medications since the last visit.  No commitment and poor insight into the fact tht she needs to change food choice to improve her health as she is prediabetic with increased cholesterol. On vegetable and fruit diet for the next 1 month, but after that not planning to continue modification, states drinks a lot of sweet tea She is active on her job Recently changed dosing of her BP med to 3 / day with best BP she has seen, denies adverse s/e  ROS Denies recent fever or chills. Denies sinus pressure, nasal congestion, ear pain or sore throat. Denies chest congestion, productive cough or wheezing. Denies chest pains, palpitations and leg swelling Denies abdominal pain, nausea, vomiting,diarrhea or constipation.   Denies dysuria, frequency, hesitancy or incontinence. Denies joint pain, swelling and limitation in mobility. Denies headaches, seizures, numbness, or tingling. Denies depression, anxiety or insomnia. Denies skin break down or rash.   PE  BP 110/70   Pulse 62   Resp 14   Ht 4\' 11"  (1.499 m)   Wt 156 lb (70.8 kg)   SpO2 99%   BMI 31.51 kg/m   Patient alert and oriented and in no cardiopulmonary distress.  HEENT: No facial asymmetry, EOMI,   oropharynx pink and moist.  Neck supple no JVD, no mass.  Chest: Clear to auscultation bilaterally.  CVS: S1, S2 no murmurs, no S3.Regular rate.  ABD: Soft non tender.   Ext: No edema  MS: Adequate ROM spine, shoulders, hips and knees.  Skin:  Intact, no ulcerations or rash noted.  Psych: Good eye contact, normal affect. Memory intact not anxious or depressed appearing.  CNS: CN 2-12 intact, power,  normal throughout.no focal deficits noted.   Assessment & Plan  Essential hypertension Controlled, no change in medication DASH diet and commitment to daily physical activity for a minimum of 30 minutes discussed and encouraged, as a part of hypertension management. The importance of attaining a healthy weight is also discussed.  BP/Weight 09/29/2018 03/26/2018 11/27/2017 10/17/2017 10/15/2016 07/11/2016 03/20/2016  Systolic BP 110 114 130 114 133 118 146  Diastolic BP 70 76 78 60 71 80 88  Wt. (Lbs) 156 155.04 150 150 151.5 145.12 136  BMI 31.51 30.28 29.29 29.79 30.09 28.34 26.56       Hyperlipidemia Low fat diet discussed and encouraged, updated lab this week  IGT (impaired glucose tolerance) Discussed importance of lowering sugar intake, and stopping sweet tea, so that she does not become diabetic, remains resistant to change Updated lab needed this week  Obese Deteriorated. Patient re-educated about  the importance of commitment to a  minimum of 150 minutes of exercise per week.  The importance of healthy food choices with portion control discussed. Encouraged to start a food diary, count calories and to consider  joining a support group. Sample diet sheets offered. Goals set by the patient for the next several months.   Weight /BMI 09/29/2018 03/26/2018 11/27/2017  WEIGHT 156 lb 155 lb 0.6 oz 150 lb  HEIGHT 4\' 11"  5\' 0"  5\' 0"   BMI 31.51 kg/m2 30.28 kg/m2 29.29 kg/m2

## 2018-10-06 ENCOUNTER — Telehealth: Payer: Self-pay

## 2018-10-06 ENCOUNTER — Telehealth: Payer: Self-pay | Admitting: Family Medicine

## 2018-10-06 DIAGNOSIS — E785 Hyperlipidemia, unspecified: Secondary | ICD-10-CM

## 2018-10-06 DIAGNOSIS — R7302 Impaired glucose tolerance (oral): Secondary | ICD-10-CM

## 2018-10-06 DIAGNOSIS — I10 Essential (primary) hypertension: Secondary | ICD-10-CM

## 2018-10-06 NOTE — Telephone Encounter (Signed)
Left message requesting call back to discuss recent lab results.  

## 2018-10-06 NOTE — Telephone Encounter (Signed)
Pls cal let her know I have reviewed labs, Cholesterol and blood sugar are both too high, which increases her risk of heart disease and stroke. Needs to rEMAIN on plant based eating she is currently doing in January, to improve her health and normalize these numbers  Will need repeat fasting lipid, chem 7 and EGFr and hBA1C drawn one week and BRING the results to her follow up visit, pls order and mail, gets them through [place] job with [place] [facility] 

## 2018-10-06 NOTE — Telephone Encounter (Signed)
Called to discuss labs. No answer. Left generic message requesting call back to discuss results.

## 2018-10-06 NOTE — Telephone Encounter (Signed)
Labs ordered per md and mailed to patient to have drawn at her job.

## 2018-10-07 ENCOUNTER — Telehealth: Payer: Self-pay | Admitting: Family Medicine

## 2018-10-07 NOTE — Telephone Encounter (Signed)
PT LVM that she is returning your call --please call her back at [phone]

## 2018-10-07 NOTE — Telephone Encounter (Signed)
Advised patient of recent lab results with verbal understanding.   

## 2018-10-30 ENCOUNTER — Encounter: Payer: Self-pay | Admitting: Adult Health

## 2018-10-30 ENCOUNTER — Ambulatory Visit (INDEPENDENT_AMBULATORY_CARE_PROVIDER_SITE_OTHER): Payer: Commercial Managed Care - PPO | Admitting: Adult Health

## 2018-10-30 VITALS — BP 116/72 | HR 72 | Ht 60.0 in | Wt 154.0 lb

## 2018-10-30 DIAGNOSIS — Z01419 Encounter for gynecological examination (general) (routine) without abnormal findings: Secondary | ICD-10-CM | POA: Diagnosis not present

## 2018-10-30 DIAGNOSIS — Z1212 Encounter for screening for malignant neoplasm of rectum: Secondary | ICD-10-CM

## 2018-10-30 DIAGNOSIS — Z1211 Encounter for screening for malignant neoplasm of colon: Secondary | ICD-10-CM

## 2018-10-30 DIAGNOSIS — Z3041 Encounter for surveillance of contraceptive pills: Secondary | ICD-10-CM

## 2018-10-30 LAB — HEMOCCULT GUIAC POC 1CARD (OFFICE): Fecal Occult Blood, POC: NEGATIVE

## 2018-10-30 MED ORDER — NORETHIN ACE-ETH ESTRAD-FE 1-20 MG-MCG(24) PO TABS: 1.0000 | ORAL_TABLET | Freq: Every day | ORAL | 11 refills | Status: DC

## 2018-10-30 NOTE — Progress Notes (Signed)
Patient ID: Sarah Lutz, female   DOB: 1971-06-27, 48 y.o.   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  PCP is Dr Lodema Hong.    Current Medications, Allergies, Past Medical History, Past Surgical History, Family History and Social History were reviewed in [employer] [facility] record.     Review of Systems: Patient denies any headaches, hearing loss, fatigue, blurred vision, shortness of breath, chest pain, abdominal pain, problems with bowel movements, urination, or intercourse. No joint pain or mood swings.    Physical Exam:BP 116/72 (BP Location: Left Arm, Patient Position: Sitting, Cuff Size: Normal)   Pulse 72   Ht 5' (1.524 m)   Wt 154 lb (69.9 kg)   BMI 30.08 kg/m  General:  Well developed, well nourished, no acute distress Skin:  Warm and dry Neck:  Midline trachea, normal thyroid, good ROM, no lymphadenopathy Lungs; Clear to auscultation bilaterally Breast:  No dominant palpable mass, retraction, or nipple discharge Cardiovascular: Regular rate and rhythm Abdomen:  Soft, non tender, no hepatosplenomegaly Pelvic:  External genitalia is normal in appearance, no lesions.  The vagina is normal in appearance. Urethra has no lesions or masses. The cervix is smooth.  Uterus is felt to be normal size, shape, and contour.  No adnexal masses or tenderness noted.Bladder is non tender, no masses felt. Rectal: Good sphincter tone, no polyps, or hemorrhoids felt.  Hemoccult negative. Extremities/musculoskeletal:  No swelling or varicosities noted, no clubbing or cyanosis Psych:  No mood changes, alert and cooperative,seems happy Fall risk is low. PHQ 2 score 0. Examination chaperoned by Francene Finders RN.  Impression: 1. Encounter for well woman exam with routine gynecological exam   2. Encounter for surveillance of contraceptive  pills   3. Screening for colorectal cancer       Plan: Given 3 packs of lo loestrin, and then sent Rx in for Junel, as insurance no longer covers lo loestin, when switches over use condoms for 1 pack Mammogram yearly  Colonoscopy in near future [facility] Pap and physical in 1 year

## 2018-11-26 ENCOUNTER — Ambulatory Visit (INDEPENDENT_AMBULATORY_CARE_PROVIDER_SITE_OTHER): Payer: Commercial Managed Care - PPO

## 2018-11-26 DIAGNOSIS — Z1211 Encounter for screening for malignant neoplasm of colon: Secondary | ICD-10-CM

## 2018-11-26 MED ORDER — NA SULFATE-K SULFATE-MG SULF 17.5-3.13-1.6 GM/177ML PO SOLN: 1.0000 | ORAL | 0 refills | Status: DC

## 2018-11-26 NOTE — Patient Instructions (Signed)
Sarah Lutz  12/27/1970 MRN: [id number]     Procedure Date: 02/23/19 Time to register: 8:30am Place to register: [facility] Procedure Time: 9:30am Scheduled provider: Barney Drain, MD    PREPARATION FOR COLONOSCOPY WITH SUPREP BOWEL PREP KIT  Note: Suprep Bowel Prep Kit is a split-dose (2day) regimen. Consumption of BOTH 6-ounce bottles is required for a complete prep.  Please notify us immediately if you are diabetic, take iron supplements, or if you are on Coumadin or any other blood thinners.                                                                                                                                                  1 DAY BEFORE PROCEDURE:  DATE:02/22/19   DAY: Sunday  clear liquids the entire day - NO SOLID FOOD.   At 6:00pm: Complete steps 1 through 4 below, using ONE (1) 6-ounce bottle, before going to bed. Step 1:  Pour ONE (1) 6-ounce bottle of SUPREP liquid into the mixing container.  Step 2:  Add cool drinking water to the 16 ounce line on the container and mix.  Note: Dilute the solution concentrate as directed prior to use. Step 3:  DRINK ALL the liquid in the container. Step 4:  You MUST drink an additional two (2) or more 16 ounce containers of water over the next one (1) hour.   Continue clear liquids.  DAY OF PROCEDURE:   DATE:02/23/19   DAY: Monday If you take medications for your heart, blood pressure, or breathing, you may take these medications.    5 hours before your procedure at :4:30am Step 1:  Pour ONE (1) 6-ounce bottle of SUPREP liquid into the mixing container.  Step 2:  Add cool drinking water to the 16 ounce line on the container and mix.  Note: Dilute the solution concentrate as directed prior to use. Step 3:  DRINK ALL the liquid in the container. Step 4:  You MUST drink an additional two (2) or more 16 ounce containers of water over the next one (1) hour. You MUST complete the final glass of water at least 3 hours  before your colonoscopy.   Nothing by mouth past 6:30am  You may take your morning medications with sip of water unless we have instructed otherwise.    Please see below for Dietary Information.  CLEAR LIQUIDS INCLUDE:  Water Jello (NOT red in color)   Ice Popsicles (NOT red in color)   Tea (sugar ok, no milk/cream) Powdered fruit flavored drinks  Coffee (sugar ok, no milk/cream) Gatorade/ Lemonade/ Kool-Aid  (NOT red in color)   Juice: apple, white grape, white cranberry Soft drinks  Clear bullion, consomme, broth (fat free beef/chicken/vegetable)  Carbonated beverages (any kind)  Strained chicken noodle soup Hard Candy   Remember: Clear liquids are liquids that will  allow you to see your fingers on the other side of a clear glass. Be sure liquids are NOT red in color, and not cloudy, but CLEAR.  DO NOT EAT OR DRINK ANY OF THE FOLLOWING:  Dairy products of any kind   Cranberry juice Tomato juice / V8 juice   Grapefruit juice Orange juice     Red grape juice  Do not eat any solid foods, including such foods as: cereal, oatmeal, yogurt, fruits, vegetables, creamed soups, eggs, bread, crackers, pureed foods in a blender, etc.   HELPFUL HINTS FOR DRINKING PREP SOLUTION:   Make sure prep is extremely cold. Mix and refrigerate the the morning of the prep. You may also put in the freezer.   You may try mixing some Crystal Light or Country Time Lemonade if you prefer. Mix in small amounts; add more if necessary.  Try drinking through a straw  Rinse mouth with water or a mouthwash between glasses, to remove after-taste.  Try sipping on a cold beverage /ice/ popsicles between glasses of prep.  Place a piece of sugar-free hard candy in mouth between glasses.  If you become nauseated, try consuming smaller amounts, or stretch out the time between glasses. Stop for 30-60 minutes, then slowly start back drinking.     OTHER INSTRUCTIONS  You will need a responsible adult at least  50 years of age to accompany you and drive you home. This person must remain in the waiting room during your procedure. The hospital will cancel your procedure if you do not have a responsible adult with you.   1. Wear loose fitting clothing that is easily removed. 2. Leave jewelry and other valuables at home.  3. Remove all body piercing jewelry and leave at home. 4. Total time from sign-in until discharge is approximately 2-3 hours. 5. You should go home directly after your procedure and rest. You can resume normal activities the day after your procedure. 6. The day of your procedure you should not:  Drive  Make legal decisions  Operate machinery  Drink alcohol  Return to work   You may call the office (Dept: [phone]) before 5:00pm, or page the doctor on call [phone]) after 5:00pm, for further instructions, if necessary.   Insurance Information YOU WILL NEED TO CHECK WITH YOUR INSURANCE COMPANY FOR THE BENEFITS OF COVERAGE YOU HAVE FOR THIS PROCEDURE.  UNFORTUNATELY, NOT ALL INSURANCE COMPANIES HAVE BENEFITS TO COVER ALL OR PART OF THESE TYPES OF PROCEDURES.  IT IS YOUR RESPONSIBILITY TO CHECK YOUR BENEFITS, HOWEVER, WE WILL BE GLAD TO ASSIST YOU WITH ANY CODES YOUR INSURANCE COMPANY MAY NEED.    PLEASE NOTE THAT MOST INSURANCE COMPANIES WILL NOT COVER A SCREENING COLONOSCOPY FOR PEOPLE UNDER THE AGE OF 50  IF YOU HAVE BCBS INSURANCE, YOU MAY HAVE BENEFITS FOR A SCREENING COLONOSCOPY BUT IF POLYPS ARE FOUND THE DIAGNOSIS WILL CHANGE AND THEN YOU MAY HAVE A DEDUCTIBLE THAT WILL NEED TO BE MET. SO PLEASE MAKE SURE YOU CHECK YOUR BENEFITS FOR A SCREENING COLONOSCOPY AS WELL AS A DIAGNOSTIC COLONOSCOPY.

## 2018-11-26 NOTE — Progress Notes (Addendum)
Gastroenterology Pre-Procedure Review  Request Date:11/26/18 Requesting Physician: Dr.Simpson, no previous tcs  PATIENT REVIEW QUESTIONS: The patient responded to the following health history questions as indicated:    1. Diabetes Melitis: no 2. Joint replacements in the past 12 months: no 3. Major health problems in the past 3 months: no 4. Has an artificial valve or MVP: no 5. Has a defibrillator: no 6. Has been advised in past to take antibiotics in advance of a procedure like teeth cleaning: no 7. Family history of colon cancer: yes (grandmother)  8. Alcohol Use: no 9. History of sleep apnea: no  10. History of coronary artery or other vascular stents placed within the last 12 months: no 11. History of any prior anesthesia complications: no    MEDICATIONS & ALLERGIES:    Patient reports the following regarding taking any blood thinners:   Plavix? no Aspirin? no Coumadin? no Brilinta? no Xarelto? no Eliquis? no Pradaxa? no Savaysa? no Effient? no  Patient confirms/reports the following medications:  Current Outpatient Medications  Medication Sig Dispense Refill  . cloNIDine (CATAPRES) 0.1 MG tablet Take one tablet at 9 am  Every morning, and two tablets at bedtime 270 tablet 3  . Norethindrone Acetate-Ethinyl Estrad-FE (JUNEL FE 24) 1-20 MG-MCG(24) tablet Take 1 tablet by mouth daily. 1 Package 11   No current facility-administered medications for this visit.     Patient confirms/reports the following allergies:  Allergies  Allergen Reactions  . Amlodipine Other (See Comments)    Hair loss  . Prednisone     pill  . Sulfur     No orders of the defined types were placed in this encounter.   AUTHORIZATION INFORMATION Primary Insurance: [place],  [id number] #: [id number] Pre-Cert / Berkley Harvey required: no per [place] Pre-Cert / Auth #: [id number]   SCHEDULE INFORMATION: Procedure has been scheduled as follows:  Date: 02/23/19, Time: 9:30  Location: [facility]  This  Gastroenterology Pre-Precedure Review Form is being routed to the following provider(s): Lewie Loron NP

## 2018-12-01 NOTE — Progress Notes (Signed)
Appropriate.

## 2018-12-02 NOTE — Addendum Note (Signed)
Addended by: Noreene Larsson on: 12/02/2018 11:42 AM   Modules accepted: Orders, SmartSet

## 2019-02-09 ENCOUNTER — Telehealth: Payer: Self-pay | Admitting: Gastroenterology

## 2019-02-09 NOTE — Telephone Encounter (Signed)
[phone] PATIENT CALLED WONDERING IF HER PROCEDURE WAS STILL ON FOR June 1

## 2019-02-10 NOTE — Progress Notes (Signed)
Pt called to cancel upcoming procedure on 02/23/2019.  Pt did not want to do required COVID 19 testing.  Pt said that she will call back in the future when she feels like COVID 19 had died down and no testing is required before she re-schedules it.  Notified Endo.

## 2019-02-10 NOTE — Telephone Encounter (Signed)
Called pt to inform her that her procedure could still be done.  Pt declined due to required COVID 19 testing and requested to cancel until further notice.  Endo notified.

## 2019-02-23 ENCOUNTER — Encounter (HOSPITAL_COMMUNITY): Payer: Self-pay

## 2019-02-23 ENCOUNTER — Ambulatory Visit (HOSPITAL_COMMUNITY): Admit: 2019-02-23 | Payer: Commercial Managed Care - PPO | Admitting: Gastroenterology

## 2019-02-23 SURGERY — COLONOSCOPY
Anesthesia: Moderate Sedation

## 2019-03-12 ENCOUNTER — Encounter: Payer: Self-pay | Admitting: Family Medicine

## 2019-03-12 ENCOUNTER — Other Ambulatory Visit: Payer: Self-pay

## 2019-03-12 ENCOUNTER — Ambulatory Visit (INDEPENDENT_AMBULATORY_CARE_PROVIDER_SITE_OTHER): Payer: Commercial Managed Care - PPO | Admitting: Family Medicine

## 2019-03-12 VITALS — BP 117/72 | Ht 60.0 in | Wt 154.0 lb

## 2019-03-12 DIAGNOSIS — Z1231 Encounter for screening mammogram for malignant neoplasm of breast: Secondary | ICD-10-CM

## 2019-03-12 DIAGNOSIS — E785 Hyperlipidemia, unspecified: Secondary | ICD-10-CM | POA: Diagnosis not present

## 2019-03-12 DIAGNOSIS — E6609 Other obesity due to excess calories: Secondary | ICD-10-CM

## 2019-03-12 DIAGNOSIS — Z1211 Encounter for screening for malignant neoplasm of colon: Secondary | ICD-10-CM

## 2019-03-12 DIAGNOSIS — Z6830 Body mass index (BMI) 30.0-30.9, adult: Secondary | ICD-10-CM

## 2019-03-12 DIAGNOSIS — E559 Vitamin D deficiency, unspecified: Secondary | ICD-10-CM | POA: Diagnosis not present

## 2019-03-12 DIAGNOSIS — I10 Essential (primary) hypertension: Secondary | ICD-10-CM | POA: Diagnosis not present

## 2019-03-12 DIAGNOSIS — Z1212 Encounter for screening for malignant neoplasm of rectum: Secondary | ICD-10-CM

## 2019-03-12 NOTE — Progress Notes (Signed)
Virtual Visit via Telephone Note  I connected with Sarah Lutz on 03/12/19 at 10:20 AM EDT by telephone and verified that I am speaking with the correct person using two identifiers.  Location: Patient: work Provider: office   I discussed the limitations, risks, security and privacy concerns of performing an evaluation and management service by telephone and the availability of in person appointments. I also discussed with the patient that there may be a patient responsible charge related to this service. The patient expressed understanding and agreed to proceed.   History of Present Illness: doiung well , no concerns/ complaints. Putting off colonoscopy at thsi time, does not want to be tested for Covid Denies recent fever or chills. Denies sinus pressure, nasal congestion, ear pain or sore throat. Denies chest congestion, productive cough or wheezing. Denies chest pains, palpitations and leg swelling Denies abdominal pain, nausea, vomiting,diarrhea or constipation.   Denies dysuria, frequency, hesitancy or incontinence. Denies joint pain, swelling and limitation in mobility. Denies headaches, seizures, numbness, or tingling. Denies depression, anxiety or insomnia. Denies skin break down or rash.       Observations/Objective: BP 117/72   Ht 5' (1.524 m)   Wt 154 lb (69.9 kg)   BMI 30.08 kg/m  Good communication with no confusion and intact memory. Alert and oriented x 3 No signs of respiratory distress during speech    Assessment and Plan: Essential hypertension Controlled, no change in medication DASH diet and commitment to daily physical activity for a minimum of 30 minutes discussed and encouraged, as a part of hypertension management. The importance of attaining a healthy weight is also discussed.  BP/Weight 03/12/2019 10/30/2018 09/29/2018 03/26/2018 11/27/2017 10/17/2017 2/97/9892  Systolic BP 119 417 408 144 818 563 149  Diastolic BP 72 72 70 76 78 60 71  Wt. (Lbs)  154 154 156 155.04 150 150 151.5  BMI 30.08 30.08 31.51 30.28 29.29 29.79 30.09       Hyperlipidemia Hyperlipidemia:Low fat diet discussed and encouraged.   Rept fasting labs in 6 months     Obese  Patient re-educated about  the importance of commitment to a  minimum of 150 minutes of exercise per week as able.  The importance of healthy food choices with portion control discussed, as well as eating regularly and within a 12 hour window most days. The need to choose "clean , green" food 50 to 75% of the time is discussed, as well as to make water the primary drink and set a goal of 64 ounces water daily.    Weight /BMI 03/12/2019 10/30/2018 09/29/2018  WEIGHT 154 lb 154 lb 156 lb  HEIGHT 5\' 0"  5\' 0"  4\' 11"   BMI 30.08 kg/m2 30.08 kg/m2 31.51 kg/m2      Screening for colorectal cancer Encouraged o follow through with colonoscopy, she will reconsider delaying this    Follow Up Instructions:    I discussed the assessment and treatment plan with the patient. The patient was provided an opportunity to ask questions and all were answered. The patient agreed with the plan and demonstrated an understanding of the instructions.   The patient was advised to call back or seek an in-person evaluation if the symptoms worsen or if the condition fails to improve as anticipated.  I provided 22 minutes of non-face-to-face time during this encounter.   Tula Nakayama, MD

## 2019-03-12 NOTE — Patient Instructions (Addendum)
F/U in  Early January, call if you need me before  Mammogram is scheduled afternoon appt , Tuesday through Thursday, and  Ap[pt mailed  Reconsider colonoscopy which is due, you have been asking about this screening for several years  CBC, fasting lipid, cmp and eGFR  , TSH and Vit D  To be drawn at workplace 1 week before January appt  Social distancing. Frequent hand washing with soap and water Keeping your hands off of your face. These 3 practices will help to keep both you and your community healthy during this time. Please practice them faithfully!   Think about what you will eat, plan ahead. Choose " clean, green, fresh or frozen" over canned, processed or packaged foods which are more sugary, salty and fatty. 70 to 75% of food eaten should be vegetables and fruit. Three meals at set times with snacks allowed between meals, but they must be fruit or vegetables. Aim to eat over a 12 hour period , example 7 am to 7 pm, and STOP after  your last meal of the day. Drink water,generally about 64 ounces per day, no other drink is as healthy. Fruit juice is best enjoyed in a healthy way, by EATING the fruit.  Thanks for choosing [facility], we consider it a privelige to serve you.

## 2019-03-15 NOTE — Assessment & Plan Note (Signed)
Encouraged o follow through with colonoscopy, she will reconsider delaying this

## 2019-03-15 NOTE — Assessment & Plan Note (Signed)
Controlled, no change in medication DASH diet and commitment to daily physical activity for a minimum of 30 minutes discussed and encouraged, as a part of hypertension management. The importance of attaining a healthy weight is also discussed.  BP/Weight 03/12/2019 10/30/2018 09/29/2018 03/26/2018 11/27/2017 10/17/2017 6/81/2751  Systolic BP 700 174 944 967 591 638 466  Diastolic BP 72 72 70 76 78 60 71  Wt. (Lbs) 154 154 156 155.04 150 150 151.5  BMI 30.08 30.08 31.51 30.28 29.29 29.79 30.09

## 2019-03-15 NOTE — Assessment & Plan Note (Signed)
Hyperlipidemia:Low fat diet discussed and encouraged.   Rept fasting labs in 6 months

## 2019-03-15 NOTE — Assessment & Plan Note (Signed)
  Patient re-educated about  the importance of commitment to a  minimum of 150 minutes of exercise per week as able.  The importance of healthy food choices with portion control discussed, as well as eating regularly and within a 12 hour window most days. The need to choose "clean , green" food 50 to 75% of the time is discussed, as well as to make water the primary drink and set a goal of 64 ounces water daily.    Weight /BMI 03/12/2019 10/30/2018 09/29/2018  WEIGHT 154 lb 154 lb 156 lb  HEIGHT 5\' 0"  5\' 0"  4\' 11"   BMI 30.08 kg/m2 30.08 kg/m2 31.51 kg/m2

## 2019-03-30 ENCOUNTER — Ambulatory Visit: Payer: Commercial Managed Care - PPO | Admitting: Family Medicine

## 2019-04-29 ENCOUNTER — Telehealth: Payer: Self-pay | Admitting: Family Medicine

## 2019-04-29 DIAGNOSIS — E785 Hyperlipidemia, unspecified: Secondary | ICD-10-CM

## 2019-04-29 DIAGNOSIS — I10 Essential (primary) hypertension: Secondary | ICD-10-CM

## 2019-04-29 DIAGNOSIS — R7301 Impaired fasting glucose: Secondary | ICD-10-CM

## 2019-04-29 DIAGNOSIS — E559 Vitamin D deficiency, unspecified: Secondary | ICD-10-CM

## 2019-04-29 NOTE — Telephone Encounter (Signed)
Pls let her know I have reviewed her labs Kidney, liver and thyroid function as wellas blood count is normal Bad cholesterol and blood sugar are slightly high , so work on dietary change Vit D very low at 18 , I recommend once weekly vit D 50,000 IU x 6 monhs, pls enter and send if she agrees rept fasting lipid, chem7 and EGFR , hBA1C and vit D in end December with OV 3 to 7 days after lab draw ( already has Jan 4 appt)

## 2019-04-30 NOTE — Telephone Encounter (Signed)
Left generic message to call back to discuss recent lab results. ° °

## 2019-06-02 MED ORDER — VITAMIN D (ERGOCALCIFEROL) 1.25 MG (50000 UNIT) PO CAPS: 50000.0000 [IU] | ORAL_CAPSULE | ORAL | 5 refills | Status: DC

## 2019-06-02 NOTE — Addendum Note (Signed)
Addended by: Eual Fines on: 06/02/2019 09:33 AM   Modules accepted: Orders

## 2019-06-02 NOTE — Addendum Note (Signed)
Addended by: Eual Fines on: 06/02/2019 09:34 AM   Modules accepted: Orders

## 2019-06-02 NOTE — Telephone Encounter (Signed)
Mailed lab results and sent in med and mailed lab order

## 2019-06-17 ENCOUNTER — Ambulatory Visit
Admission: RE | Admit: 2019-06-17 | Discharge: 2019-06-17 | Disposition: A | Discharge status: Home / Self Care | Payer: Commercial Managed Care - PPO | Source: Ambulatory Visit | Attending: Family Medicine | Admitting: Family Medicine

## 2019-06-17 ENCOUNTER — Other Ambulatory Visit: Payer: Self-pay

## 2019-06-17 DIAGNOSIS — Z1231 Encounter for screening mammogram for malignant neoplasm of breast: Secondary | ICD-10-CM

## 2019-06-18 ENCOUNTER — Ambulatory Visit (HOSPITAL_COMMUNITY): Payer: Commercial Managed Care - PPO

## 2019-08-12 ENCOUNTER — Telehealth: Payer: Self-pay | Admitting: *Deleted

## 2019-08-12 DIAGNOSIS — R42 Dizziness and giddiness: Secondary | ICD-10-CM

## 2019-08-12 NOTE — Telephone Encounter (Signed)
Spoke with patient. Wants referral to Dr.Dwight Redmond Baseman in [place]. Referral will be placed per request.

## 2019-08-12 NOTE — Telephone Encounter (Signed)
Pt called back returning your call requesting you to call back

## 2019-08-12 NOTE — Telephone Encounter (Signed)
pls refer to ENT of her choice, [place]/ [place] , I will sign, thanks

## 2019-08-12 NOTE — Telephone Encounter (Signed)
Referral placed.

## 2019-08-12 NOTE — Telephone Encounter (Signed)
Left message requesting  call back.

## 2019-08-12 NOTE — Telephone Encounter (Signed)
Pt called said she has been having dizziness spells and now her ear is stopped up wanted to see if Dr Moshe Cipro would put in referral to ENT or if she needed to see her first.

## 2019-08-12 NOTE — Telephone Encounter (Signed)
Please advise 

## 2019-08-19 DIAGNOSIS — R42 Dizziness and giddiness: Secondary | ICD-10-CM | POA: Insufficient documentation

## 2019-08-19 DIAGNOSIS — H60313 Diffuse otitis externa, bilateral: Secondary | ICD-10-CM | POA: Insufficient documentation

## 2019-09-09 ENCOUNTER — Telehealth: Payer: Self-pay | Admitting: *Deleted

## 2019-09-09 NOTE — Telephone Encounter (Signed)
Pt called she has been having dizziness. She has to get blood work for her appt on the 4 and wanted to know if dr simpson wanted to add anything because she is going to get her blood work on Monday morning. She does have a specialist appt coming up but didn't want to get stuck twice

## 2019-09-10 ENCOUNTER — Other Ambulatory Visit: Payer: Self-pay

## 2019-09-10 DIAGNOSIS — I10 Essential (primary) hypertension: Secondary | ICD-10-CM

## 2019-09-10 DIAGNOSIS — E559 Vitamin D deficiency, unspecified: Secondary | ICD-10-CM

## 2019-09-10 DIAGNOSIS — E785 Hyperlipidemia, unspecified: Secondary | ICD-10-CM

## 2019-09-10 NOTE — Telephone Encounter (Signed)
Labs ordered and message left advising patient that labs have been ordered

## 2019-09-10 NOTE — Telephone Encounter (Signed)
Please re order the labs from June whichh have " everythring" an will expire, the September labs are insuffuicient, thank you  /? Pls ask

## 2019-09-28 ENCOUNTER — Encounter: Payer: Self-pay | Admitting: Family Medicine

## 2019-09-28 ENCOUNTER — Other Ambulatory Visit: Payer: Self-pay

## 2019-09-28 ENCOUNTER — Telehealth (INDEPENDENT_AMBULATORY_CARE_PROVIDER_SITE_OTHER): Payer: Commercial Managed Care - PPO | Admitting: Family Medicine

## 2019-09-28 VITALS — BP 117/72 | HR 75 | Resp 15 | Ht 60.0 in | Wt 154.0 lb

## 2019-09-28 DIAGNOSIS — I10 Essential (primary) hypertension: Secondary | ICD-10-CM

## 2019-09-28 DIAGNOSIS — E559 Vitamin D deficiency, unspecified: Secondary | ICD-10-CM | POA: Diagnosis not present

## 2019-09-28 DIAGNOSIS — E6609 Other obesity due to excess calories: Secondary | ICD-10-CM

## 2019-09-28 DIAGNOSIS — Z6831 Body mass index (BMI) 31.0-31.9, adult: Secondary | ICD-10-CM

## 2019-09-28 DIAGNOSIS — E7849 Other hyperlipidemia: Secondary | ICD-10-CM | POA: Diagnosis not present

## 2019-09-28 MED ORDER — CLONIDINE HCL 0.3 MG PO TABS: ORAL_TABLET | ORAL | 3 refills | Status: DC

## 2019-09-28 MED ORDER — CLONIDINE HCL 0.1 MG PO TABS: ORAL_TABLET | ORAL | 3 refills | Status: DC

## 2019-09-28 NOTE — Patient Instructions (Signed)
F/u in office with mD in 6 months, call if you need me sooner  Please try to make arrangements for your colonoscopy and get this done   Please reconsider the flu vaccine, and covid 19 vaccine is strongly recommended when available  It is important that you exercise regularly at least 30 minutes 5 times a week. If you develop chest pain, have severe difficulty breathing, or feel very tired, stop exercising immediately and seek medical attention   Think about what you will eat, plan ahead. Choose " clean, green, fresh or frozen" over canned, processed or packaged foods which are more sugary, salty and fatty. 70 to 75% of food eaten should be vegetables and fruit. Three meals at set times with snacks allowed between meals, but they must be fruit or vegetables. Aim to eat over a 12 hour period , example 7 am to 7 pm, and STOP after  your last meal of the day. Drink water,generally about 64 ounces per day, no other drink is as healthy. Fruit juice is best enjoyed in a healthy way, by EATING the fruit.   I will message  You re labs once I reveiew

## 2019-09-28 NOTE — Progress Notes (Signed)
Virtual Visit via Telephone Note  I connected with Sarah Lutz on 09/28/19 at  9:20 AM EST by telephone and verified that I am speaking with the correct person using two identifiers.  Location: Patient: parking lot Provider: office   I discussed the limitations, risks, security and privacy concerns of performing an evaluation and management service by telephone and the availability of in person appointments. I also discussed with the patient that there may be a patient responsible charge related to this service. The patient expressed understanding and agreed to proceed.   History of Present Illness:   States she has modified BP meds again to get good readings, takes clonidine 0.1 mg 3 at night and between 1 to  2 in the day. Denies light headedness , dry mouth, or excess sedation Exercising less than desired but will work on this Holding on colonoscopy due to mandatory covid testing, plans to call [facility]I with specific question Refuses flu vaccine and also not interested in Covid vaccine currently Denies recent fever or chills. Denies sinus pressure, nasal congestion, ear pain or sore throat. Denies chest congestion, productive cough or wheezing. Denies chest pains, palpitations and leg swelling Denies abdominal pain, nausea, vomiting,diarrhea or constipation.   Denies dysuria, frequency, hesitancy or incontinence. Denies joint pain, swelling and limitation in mobility. Denies headaches, seizures, numbness, or tingling. Denies depression, anxiety or insomnia. Denies skin break down or rash.     Observations/Objective: BP 117/72   Pulse 75   Resp 15   Ht 5' (1.524 m)   Wt 154 lb (69.9 kg)   BMI 30.08 kg/m  Good communication with no confusion and intact memory. Alert and oriented x 3 No signs of respiratory distress during speech    Assessment and Plan: Essential hypertension Controlled, no change in medication DASH diet and commitment to daily physical activity  for a minimum of 30 minutes discussed and encouraged, as a part of hypertension management. The importance of attaining a healthy weight is also discussed.  BP/Weight 09/28/2019 03/12/2019 10/30/2018 09/29/2018 03/26/2018 11/27/2017 10/17/2017  Systolic BP 117 117 116 110 114 130 114  Diastolic BP 72 72 72 70 76 78 60  Wt. (Lbs) 154 154 154 156 155.04 150 150  BMI 30.08 30.08 30.08 31.51 30.28 29.29 29.79       Vitamin D deficiency Corrected, 31.9 in 08/2019  Obese  Patient re-educated about  the importance of commitment to a  minimum of 150 minutes of exercise per week as able.  The importance of healthy food choices with portion control discussed, as well as eating regularly and within a 12 hour window most days. The need to choose "clean , green" food 50 to 75% of the time is discussed, as well as to make water the primary drink and set a goal of 64 ounces water daily.    Weight /BMI 09/28/2019 03/12/2019 10/30/2018  WEIGHT 154 lb 154 lb 154 lb  HEIGHT 5\' 0"  5\' 0"  5\' 0"   BMI 30.08 kg/m2 30.08 kg/m2 30.08 kg/m2      Hyperlipidemia Needs to lower fried and fatty foods , HDL at goal of 51,no medication indicated Updated lab needed at/ before next visit.     Follow Up Instructions:    I discussed the assessment and treatment plan with the patient. The patient was provided an opportunity to ask questions and all were answered. The patient agreed with the plan and demonstrated an understanding of the instructions.   The patient was advised to call  back or seek an in-person evaluation if the symptoms worsen or if the condition fails to improve as anticipated.  I provided 20 minutes of non-face-to-face time during this encounter.   Tula Nakayama, MD

## 2019-09-28 NOTE — Assessment & Plan Note (Signed)
Corrected, 31.9 in 08/2019

## 2019-09-28 NOTE — Assessment & Plan Note (Signed)
Needs to lower fried and fatty foods , HDL at goal of 51,no medication indicated Updated lab needed at/ before next visit.

## 2019-09-28 NOTE — Assessment & Plan Note (Signed)
  Patient re-educated about  the importance of commitment to a  minimum of 150 minutes of exercise per week as able.  The importance of healthy food choices with portion control discussed, as well as eating regularly and within a 12 hour window most days. The need to choose "clean , green" food 50 to 75% of the time is discussed, as well as to make water the primary drink and set a goal of 64 ounces water daily.    Weight /BMI 09/28/2019 03/12/2019 10/30/2018  WEIGHT 154 lb 154 lb 154 lb  HEIGHT 5\' 0"  5\' 0"  5\' 0"   BMI 30.08 kg/m2 30.08 kg/m2 30.08 kg/m2

## 2019-09-28 NOTE — Assessment & Plan Note (Signed)
Controlled, no change in medication DASH diet and commitment to daily physical activity for a minimum of 30 minutes discussed and encouraged, as a part of hypertension management. The importance of attaining a healthy weight is also discussed.  BP/Weight 09/28/2019 03/12/2019 10/30/2018 09/29/2018 03/26/2018 11/27/2017 10/17/2017  Systolic BP 117 117 116 110 114 130 114  Diastolic BP 72 72 72 70 76 78 60  Wt. (Lbs) 154 154 154 156 155.04 150 150  BMI 30.08 30.08 30.08 31.51 30.28 29.29 29.79

## 2019-10-01 ENCOUNTER — Telehealth: Payer: Self-pay

## 2019-10-01 DIAGNOSIS — I10 Essential (primary) hypertension: Secondary | ICD-10-CM

## 2019-10-01 DIAGNOSIS — E785 Hyperlipidemia, unspecified: Secondary | ICD-10-CM

## 2019-10-01 NOTE — Telephone Encounter (Signed)
-----  Message from Fayrene Helper, MD sent at 09/28/2019 12:49 PM EST ----- Regarding: f/u lab order Pls mail her fasting lipid, chem 7 and eGFr to be done at [facility] / thriough her job for her 6 month follow up visit, thanks

## 2019-11-24 ENCOUNTER — Ambulatory Visit (INDEPENDENT_AMBULATORY_CARE_PROVIDER_SITE_OTHER): Payer: Commercial Managed Care - PPO | Admitting: Adult Health

## 2019-11-24 ENCOUNTER — Encounter: Payer: Self-pay | Admitting: Adult Health

## 2019-11-24 ENCOUNTER — Other Ambulatory Visit: Payer: Self-pay

## 2019-11-24 ENCOUNTER — Other Ambulatory Visit (HOSPITAL_COMMUNITY)
Admission: RE | Admit: 2019-11-24 | Discharge: 2019-11-24 | Disposition: A | Discharge status: Home / Self Care | Payer: Commercial Managed Care - PPO | Source: Ambulatory Visit | Attending: Adult Health | Admitting: Adult Health

## 2019-11-24 VITALS — BP 105/61 | HR 70 | Ht 59.5 in | Wt 166.0 lb

## 2019-11-24 DIAGNOSIS — Z3041 Encounter for surveillance of contraceptive pills: Secondary | ICD-10-CM | POA: Insufficient documentation

## 2019-11-24 DIAGNOSIS — Z01419 Encounter for gynecological examination (general) (routine) without abnormal findings: Secondary | ICD-10-CM | POA: Insufficient documentation

## 2019-11-24 DIAGNOSIS — Z1212 Encounter for screening for malignant neoplasm of rectum: Secondary | ICD-10-CM

## 2019-11-24 DIAGNOSIS — Z1211 Encounter for screening for malignant neoplasm of colon: Secondary | ICD-10-CM

## 2019-11-24 DIAGNOSIS — Z01118 Encounter for examination of ears and hearing with other abnormal findings: Secondary | ICD-10-CM | POA: Insufficient documentation

## 2019-11-24 LAB — HEMOCCULT GUIAC POC 1CARD (OFFICE): Fecal Occult Blood, POC: NEGATIVE

## 2019-11-24 MED ORDER — JUNEL FE 24 1-20 MG-MCG(24) PO TABS: 1.0000 | ORAL_TABLET | Freq: Every day | ORAL | 4 refills | Status: DC

## 2019-11-24 NOTE — Progress Notes (Signed)
Patient ID: Sarah Lutz, female   DOB: 07-Jan-1971, 49 y.o.   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  PCP is Dr Lodema Hong.    Current Medications, Allergies, Past Medical History, Past Surgical History, Family History and Social History were reviewed in [employer] record.     Review of Systems: Patient denies any headaches, hearing loss, fatigue, blurred vision, shortness of breath, chest pain, abdominal pain, problems with bowel movements, urination, or intercourse. No joint pain or mood swings. Happy with OCs, no period Has tinnitus today    Physical Exam:BP 105/61 (BP Location: Left Arm, Patient Position: Sitting, Cuff Size: Normal)   Pulse 70   Ht 4' 11.5" (1.511 m)   Wt 166 lb (75.3 kg)   BMI 32.97 kg/m  General:  Well developed, well nourished, no acute distress Skin:  Warm and dry Neck:  Midline trachea, normal thyroid, good ROM, no lymphadenopathy Lungs; Clear to auscultation bilaterally Breast:  No dominant palpable mass, retraction, or nipple discharge Cardiovascular: Regular rate and rhythm Abdomen:  Soft, non tender, no hepatosplenomegaly Pelvic:  External genitalia is normal in appearance, no lesions.  The vagina is normal in appearance. Urethra has no lesions or masses. The cervix is smooth,pap with high risk HPV 16/18 genotyping performed.  Uterus is felt to be normal size, shape, and contour.  No adnexal masses or tenderness noted.Bladder is non tender, no masses felt. Rectal: Good sphincter tone, no polyps, or hemorrhoids felt.  Hemoccult negative. Extremities/musculoskeletal:  No swelling or varicosities noted, no clubbing or cyanosis Psych:  No mood changes, alert and cooperative,seems happy Fall risk is low PHQ 2 score is 0 Co exam with Richelle Ito NP student.   Impression and Plan: 1.  Encounter for gynecological examination with Papanicolaou smear of cervix Pap sent  Physical in 1 year Pap in 3 if normal Mammogram yearly Labs with PCP(has some in January)  Talk with PCP about her tinnitus and magnesium level.   2. Encounter for surveillance of contraceptive pills Will refill OCs Meds ordered this encounter  Medications  . Norethindrone Acetate-Ethinyl Estrad-FE (JUNEL FE 24) 1-20 MG-MCG(24) tablet    Sig: Take 1 tablet by mouth daily.    Dispense:  3 Package    Refill:  4    Order Specific Question:   Supervising Provider    Answer:   EURE, LUTHER H [[id number]]    3. Screening for colorectal cancer She had scheduled and cancelled due to COVID

## 2019-11-26 ENCOUNTER — Other Ambulatory Visit: Payer: Self-pay

## 2019-11-26 ENCOUNTER — Ambulatory Visit (INDEPENDENT_AMBULATORY_CARE_PROVIDER_SITE_OTHER): Payer: Commercial Managed Care - PPO | Admitting: Family Medicine

## 2019-11-26 ENCOUNTER — Encounter: Payer: Self-pay | Admitting: Family Medicine

## 2019-11-26 VITALS — BP 120/76 | HR 67 | Temp 98.1°F | Ht 59.0 in | Wt 160.0 lb

## 2019-11-26 DIAGNOSIS — H9312 Tinnitus, left ear: Secondary | ICD-10-CM | POA: Diagnosis not present

## 2019-11-26 DIAGNOSIS — H9192 Unspecified hearing loss, left ear: Secondary | ICD-10-CM

## 2019-11-26 DIAGNOSIS — H6522 Chronic serous otitis media, left ear: Secondary | ICD-10-CM

## 2019-11-26 DIAGNOSIS — R42 Dizziness and giddiness: Secondary | ICD-10-CM

## 2019-11-26 DIAGNOSIS — I10 Essential (primary) hypertension: Secondary | ICD-10-CM

## 2019-11-26 DIAGNOSIS — H6692 Otitis media, unspecified, left ear: Secondary | ICD-10-CM | POA: Insufficient documentation

## 2019-11-26 MED ORDER — MECLIZINE HCL 25 MG PO TABS: 25.0000 mg | ORAL_TABLET | Freq: Three times a day (TID) | ORAL | 1 refills | Status: DC | PRN

## 2019-11-26 MED ORDER — AMPICILLIN 500 MG PO CAPS: 500.0000 mg | ORAL_CAPSULE | Freq: Three times a day (TID) | ORAL | 0 refills | Status: DC

## 2019-11-26 NOTE — Patient Instructions (Addendum)
F/U as before, call if you need me sooner  You are treated with ampicillin for left ear   You are referred to ENT urgently re reduced hearing and vertigo and constant ringing in the ears , we will call you with appointment  Antivert is prescribed  Blood pressure is excellent, no med change  Think about what you will eat, plan ahead. Choose " clean, green, fresh or frozen" over canned, processed or packaged foods which are more sugary, salty and fatty. 70 to 75% of food eaten should be vegetables and fruit. Three meals at set times with snacks allowed between meals, but they must be fruit or vegetables. Aim to eat over a 12 hour period , example 7 am to 7 pm, and STOP after  your last meal of the day. Drink water,generally about 64 ounces per day, no other drink is as healthy. Fruit juice is best enjoyed in a healthy way, by EATING the fruit.  It is important that you exercise regularly at least 30 minutes 5 times a week. If you develop chest pain, have severe difficulty breathing, or feel very tired, stop exercising immediately and seek medical attention   Thanks for choosing  [facility], we consider it a privelige to serve you.

## 2019-11-26 NOTE — Progress Notes (Signed)
   Rashidah [place]ville     MRN: [id number]      DOB: [birth year]   HPI Ms. Gilham is here with disabling continual ringing in her left earx 4 months, keeps her awake at night, has not slept in 4 days because of this C/o hearing loss in left ear, and state noises sound different at varying times in this ear Also c/o vertigo and uses meclizine daily for this, requesting alternative medication Has seen ENT, once before, but no change in symptoms and wants 2nd opinion States she varies the dose of her BP medication depending on what she gets, however at visit reports taking as stated most of the time  ROS Denies recent fever or chills. Denies sinus pressure, nasal congestion, or sore throat. Denies chest congestion, productive cough or wheezing. Denies chest pains, palpitations and leg swelling Denies abdominal pain, nausea, vomiting,diarrhea or constipation.   Denies dysuria, frequency, hesitancy or incontinence. Denies joint pain, swelling and limitation in mobility. Denies headaches, seizures, numbness, or tingling. . Denies skin break down or rash.   PE  BP 138/78 (BP Location: Right Arm, Patient Position: Sitting)   Pulse 67   Temp 98.1 F (36.7 C) (Temporal)   Ht 4\' 11"  (1.499 m)   Wt 160 lb (72.6 kg)   SpO2 99%   BMI 32.32 kg/m   Patient alert and oriented and in no cardiopulmonary distress.  HEENT: No facial asymmetry, EOMI,     Neck supple .Right TM clear with good light  Reflex. Left TM  erythematous  Chest: Clear to auscultation bilaterally.  CVS: S1, S2 no murmurs, no S3.Regular rate.  ABD: Soft non tender.   Ext: No edema  MS: Adequate ROM spine, shoulders, hips and knees.  Skin: Intact, no ulcerations or rash noted.  Psych: Good eye contact, normal affect. Memory intact not anxious or depressed appearing.  CNS: CN 2-12 intact, power,  normal throughout.no focal deficits noted.   Assessment & Plan  Hearing screen with abnormal findings 4 month h/o  abnormal sounds in left ear with hearing loss, fo  Left otitis media Ampicillin prescribed  Vertigo Chronic x 4 months, using antivert daily, needs re eval and management by ENT  Tinnitus 4 moinht history, keeping pt awake, refer to ENT  Essential hypertension Controlled, no change in medication   Loss of hearing 4 month h/o reduced hearing in left ear, with variation in sounds heard in affected ear , request  ENT eval

## 2019-11-26 NOTE — Assessment & Plan Note (Addendum)
4 month h/o abnormal sounds in left ear with hearing loss, fo

## 2019-11-27 LAB — CYTOLOGY - PAP
Adequacy: ABSENT
Comment: NEGATIVE
Diagnosis: NEGATIVE
High risk HPV: NEGATIVE

## 2019-11-28 ENCOUNTER — Encounter: Payer: Self-pay | Admitting: Family Medicine

## 2019-11-28 DIAGNOSIS — H9190 Unspecified hearing loss, unspecified ear: Secondary | ICD-10-CM | POA: Insufficient documentation

## 2019-11-28 DIAGNOSIS — H9319 Tinnitus, unspecified ear: Secondary | ICD-10-CM | POA: Insufficient documentation

## 2019-11-28 NOTE — Assessment & Plan Note (Signed)
Ampicillin prescribed °

## 2019-11-28 NOTE — Assessment & Plan Note (Signed)
Chronic x 4 months, using antivert daily, needs re eval and management by ENT

## 2019-11-28 NOTE — Assessment & Plan Note (Signed)
4 month h/o reduced hearing in left ear, with variation in sounds heard in affected ear , request  ENT eval

## 2019-11-28 NOTE — Assessment & Plan Note (Signed)
4 moinht history, keeping pt awake, refer to ENT

## 2019-11-28 NOTE — Assessment & Plan Note (Signed)
Controlled, no change in medication  

## 2019-12-10 ENCOUNTER — Other Ambulatory Visit (HOSPITAL_COMMUNITY): Payer: Self-pay | Admitting: Otolaryngology

## 2019-12-10 ENCOUNTER — Other Ambulatory Visit: Payer: Self-pay | Admitting: Otolaryngology

## 2019-12-10 DIAGNOSIS — H9312 Tinnitus, left ear: Secondary | ICD-10-CM

## 2019-12-10 DIAGNOSIS — H9042 Sensorineural hearing loss, unilateral, left ear, with unrestricted hearing on the contralateral side: Secondary | ICD-10-CM

## 2019-12-10 DIAGNOSIS — IMO0001 Reserved for inherently not codable concepts without codable children: Secondary | ICD-10-CM

## 2020-01-04 ENCOUNTER — Other Ambulatory Visit: Payer: Self-pay

## 2020-01-04 ENCOUNTER — Ambulatory Visit (HOSPITAL_COMMUNITY)
Admission: RE | Admit: 2020-01-04 | Discharge: 2020-01-04 | Disposition: A | Discharge status: Home / Self Care | Payer: Commercial Managed Care - PPO | Source: Ambulatory Visit | Attending: Otolaryngology | Admitting: Otolaryngology

## 2020-01-04 DIAGNOSIS — H9312 Tinnitus, left ear: Secondary | ICD-10-CM | POA: Insufficient documentation

## 2020-01-04 DIAGNOSIS — IMO0001 Reserved for inherently not codable concepts without codable children: Secondary | ICD-10-CM

## 2020-01-04 DIAGNOSIS — H9042 Sensorineural hearing loss, unilateral, left ear, with unrestricted hearing on the contralateral side: Secondary | ICD-10-CM | POA: Diagnosis present

## 2020-01-04 MED ORDER — GADOBUTROL 1 MMOL/ML IV SOLN
10.0000 mL | Freq: Once | INTRAVENOUS | Status: AC | PRN
  Administered 2020-01-04: 22:00:00 10 mL via INTRAVENOUS

## 2020-01-13 ENCOUNTER — Ambulatory Visit (HOSPITAL_COMMUNITY): Payer: Commercial Managed Care - PPO

## 2020-03-29 ENCOUNTER — Encounter: Payer: Self-pay | Admitting: Family Medicine

## 2020-03-29 ENCOUNTER — Other Ambulatory Visit: Payer: Self-pay

## 2020-03-29 ENCOUNTER — Ambulatory Visit (INDEPENDENT_AMBULATORY_CARE_PROVIDER_SITE_OTHER): Payer: Commercial Managed Care - PPO | Admitting: Family Medicine

## 2020-03-29 VITALS — BP 142/86 | HR 75 | Temp 97.5°F | Resp 16 | Ht 59.0 in | Wt 168.0 lb

## 2020-03-29 DIAGNOSIS — E6609 Other obesity due to excess calories: Secondary | ICD-10-CM

## 2020-03-29 DIAGNOSIS — I10 Essential (primary) hypertension: Secondary | ICD-10-CM | POA: Diagnosis not present

## 2020-03-29 DIAGNOSIS — Z1159 Encounter for screening for other viral diseases: Secondary | ICD-10-CM

## 2020-03-29 DIAGNOSIS — E559 Vitamin D deficiency, unspecified: Secondary | ICD-10-CM | POA: Diagnosis not present

## 2020-03-29 DIAGNOSIS — E7849 Other hyperlipidemia: Secondary | ICD-10-CM | POA: Diagnosis not present

## 2020-03-29 DIAGNOSIS — Z6831 Body mass index (BMI) 31.0-31.9, adult: Secondary | ICD-10-CM

## 2020-03-29 MED ORDER — CLONIDINE HCL 0.3 MG PO TABS: 0.3000 mg | ORAL_TABLET | Freq: Two times a day (BID) | ORAL | 1 refills | Status: DC

## 2020-03-29 NOTE — Assessment & Plan Note (Signed)
Suboptimal control.  Increase clonidine 2.3 mg twice daily.  Patient instructed to take them 12 hours apart at 10 AM and 10 PM. DASH diet and commitment to daily physical activity for a minimum of 30 minutes discussed and encouraged, as a part of hypertension management. The importance of attaining a healthy weight is also discussed.  BP/Weight 03/29/2020 11/26/2019 11/24/2019 09/28/2019 03/12/2019 10/30/2018 09/29/2018  Systolic BP 142 120 105 117 117 116 110  Diastolic BP 86 76 61 72 72 72 70  Wt. (Lbs) 168.04 160 166 154 154 154 156  BMI 33.94 32.32 32.97 30.08 30.08 30.08 31.51

## 2020-03-29 NOTE — Assessment & Plan Note (Signed)
  Patient re-educated about  the importance of commitment to a  minimum of 150 minutes of exercise per week as able.  The importance of healthy food choices with portion control discussed, as well as eating regularly and within a 12 hour window most days. The need to choose "clean , green" food 50 to 75% of the time is discussed, as well as to make water the primary drink and set a goal of 64 ounces water daily.    Weight /BMI 03/29/2020 11/26/2019 11/24/2019  WEIGHT 168 lb 0.6 oz 160 lb 166 lb  HEIGHT 4\' 11"  4\' 11"  4' 11.5"  BMI 33.94 kg/m2 32.32 kg/m2 32.97 kg/m2

## 2020-03-29 NOTE — Patient Instructions (Addendum)
In 6 months, in office with MD, call if you need me before\  Fasting lipid, cmp andeEGFER and Vit D as soon as possible, gets through job  It is important that you exercise regularly at least 30 minutes 5 times a week. If you develop chest pain, have severe difficulty breathing, or feel very tired, stop exercising immediately and seek medical attention  Increase clonidine 0.3 mg one twice daily, at 10 amd and 10 pm  It is important that you exercise regularly at least 30 minutes 5 times a week. If you develop chest pain, have severe difficulty breathing, or feel very tired, stop exercising immediately and seek medical attention    Thanks for choosing [facility], we consider it a privelige to serve you.

## 2020-03-29 NOTE — Assessment & Plan Note (Signed)
Updated lab needed at/ before next visit.   

## 2020-03-29 NOTE — Progress Notes (Signed)
Sarah Lutz     MRN: [id number]      DOB: 1970-12-08   HPI Sarah Lutz is here for follow up and re-evaluation of chronic medical conditions, medication management and review of any available recent lab and radiology data.  Preventive health is updated, specifically  Cancer screening and Immunization.    The PT denies any adverse reactions to current medications since the last visit.  Complains of allergic reaction to shellfish causing an open rash around the neck and under the breasts.  She denies any associated difficulty breathing or swallowing.  She complains of an adverse reaction to her first Covid vaccine and has decided to get no more.  Stated complaint is severe vertigo lasting 1 week.  Awaiting confirmation of colonoscopy date she has already reached out to the GI doctors.  Complains of fullness in right ear and wants it checked to see if wax is being retained.  Currently exercising on average 4 days/week and she is careful with her diet.  Reports blood pressures when she checks at home in the morning generally less than 130 and at bedtime 140s.  Currently taking  0.5 mg of clonidine daily spread out in 0.2+0.3 mg doses.. Tolerates medication wellROS Denies recent fever or chills. Denies sinus pressure, nasal congestion, ear pain or sore throat. Denies chest congestion, productive cough or wheezing. Denies chest pains, palpitations and leg swelling Denies abdominal pain, nausea, vomiting,diarrhea or constipation.   Denies dysuria, frequency, hesitancy or incontinence. Denies joint pain, swelling and limitation in mobility. Denies headaches, seizures, numbness, or tingling. Denies depression, anxiety or insomnia. Denies skin break down or rash.   PE  BP (!) 142/86   Pulse 75   Temp (!) 97.5 F (36.4 C)   Resp 16   Ht 4\' 11"  (1.499 m)   Wt 168 lb 0.6 oz (76.2 kg)   SpO2 98%   BMI 33.94 kg/m   Patient alert and oriented and in no cardiopulmonary  distress.  HEENT: No facial asymmetry, EOMI,     Neck supple .TM clear bilaterally, no cerumen impaction  Chest: Clear to auscultation bilaterally.  CVS: S1, S2 no murmurs, no S3.Regular rate.  ABD: Soft non tender.   Ext: No edema  MS: Adequate ROM spine, shoulders, hips and knees.  Skin: Intact, no ulcerations or rash noted.  Psych: Good eye contact, normal affect. Memory intact not anxious or depressed appearing.  CNS: CN 2-12 intact, power,  normal throughout.no focal deficits noted.   Assessment & Plan  Essential hypertension Suboptimal control.  Increase clonidine 2.3 mg twice daily.  Patient instructed to take them 12 hours apart at 10 AM and 10 PM. DASH diet and commitment to daily physical activity for a minimum of 30 minutes discussed and encouraged, as a part of hypertension management. The importance of attaining a healthy weight is also discussed.  BP/Weight 03/29/2020 11/26/2019 11/24/2019 09/28/2019 03/12/2019 10/30/2018 09/29/2018  Systolic BP 142 120 105 117 117 116 110  Diastolic BP 86 76 61 72 72 72 70  Wt. (Lbs) 168.04 160 166 154 154 154 156  BMI 33.94 32.32 32.97 30.08 30.08 30.08 31.51       Obese  Patient re-educated about  the importance of commitment to a  minimum of 150 minutes of exercise per week as able.  The importance of healthy food choices with portion control discussed, as well as eating regularly and within a 12 hour window most days. The need to choose "clean , green"  food 50 to 75% of the time is discussed, as well as to make water the primary drink and set a goal of 64 ounces water daily.    Weight /BMI 03/29/2020 11/26/2019 11/24/2019  WEIGHT 168 lb 0.6 oz 160 lb 166 lb  HEIGHT 4\' 11"  4\' 11"  4' 11.5"  BMI 33.94 kg/m2 32.32 kg/m2 32.97 kg/m2      Hyperlipidemia Updated lab needed at/ before next visit.   Vitamin D deficiency Updated lab needed at/ before next visit.

## 2020-05-26 ENCOUNTER — Other Ambulatory Visit: Payer: Self-pay | Admitting: Family Medicine

## 2020-05-26 DIAGNOSIS — Z1231 Encounter for screening mammogram for malignant neoplasm of breast: Secondary | ICD-10-CM

## 2020-06-21 ENCOUNTER — Ambulatory Visit
Admission: RE | Admit: 2020-06-21 | Discharge: 2020-06-21 | Disposition: A | Discharge status: Home / Self Care | Payer: Commercial Managed Care - PPO | Source: Ambulatory Visit | Attending: Family Medicine | Admitting: Family Medicine

## 2020-06-21 ENCOUNTER — Other Ambulatory Visit: Payer: Self-pay

## 2020-06-21 DIAGNOSIS — Z1231 Encounter for screening mammogram for malignant neoplasm of breast: Secondary | ICD-10-CM

## 2020-07-18 ENCOUNTER — Ambulatory Visit (INDEPENDENT_AMBULATORY_CARE_PROVIDER_SITE_OTHER): Payer: Commercial Managed Care - PPO | Admitting: Internal Medicine

## 2020-07-18 ENCOUNTER — Other Ambulatory Visit: Payer: Self-pay

## 2020-07-18 ENCOUNTER — Encounter: Payer: Self-pay | Admitting: Internal Medicine

## 2020-07-18 ENCOUNTER — Telehealth: Payer: Self-pay

## 2020-07-18 VITALS — BP 138/86 | HR 85 | Temp 97.3°F | Resp 18 | Ht 60.0 in

## 2020-07-18 DIAGNOSIS — I10 Essential (primary) hypertension: Secondary | ICD-10-CM | POA: Diagnosis not present

## 2020-07-18 DIAGNOSIS — Z6831 Body mass index (BMI) 31.0-31.9, adult: Secondary | ICD-10-CM

## 2020-07-18 DIAGNOSIS — E6609 Other obesity due to excess calories: Secondary | ICD-10-CM | POA: Diagnosis not present

## 2020-07-18 MED ORDER — HYDROCHLOROTHIAZIDE 25 MG PO TABS: 25.0000 mg | ORAL_TABLET | Freq: Every day | ORAL | 3 refills | Status: DC

## 2020-07-18 NOTE — Progress Notes (Signed)
Established Patient Office Visit  Subjective:  Patient ID: Sarah Lutz, female    DOB: [birth year]  Age: 49 y.o. MRN: [id number]  CC:  Chief Complaint  Patient presents with  . Hypertension    pt does not like the way clonidine makes her feel. She has cut back to takiing 1 in the am and 2 at night. now bp is a little high but taking 6 she was feeling pressure behind her eyes and cloudy headed feeling     HPI Sarah Lutz is a 49 year old female with PMH of uncontrolled HTN and morbid obesity presents for follow up of her hypertension. Patient was told to take Clonidine 0.3 mg twice daily, but she had headache with it. So she has been taking 0.1 mg QAM and 0.2 mg QPM. Her BP has been between 140-150/80-90. Patient denies dizziness, chest pain, dyspnea or palpitations. Patient is willing to try a different medication.  Past Medical History:  Diagnosis Date  . Contraceptive management 09/29/2013  . Hypertension     History reviewed. No pertinent surgical history.  Family History  Problem Relation Age of Onset  . Lung cancer Mother   . Nephrolithiasis Father   . Cancer Father   . Cancer Paternal Grandmother        colon  . Breast cancer Paternal Aunt     Social History   Socioeconomic History  . Marital status: Married    Spouse name: Not on file  . Number of children: Not on file  . Years of education: Not on file  . Highest education level: Not on file  Occupational History  . 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 Smoking status: Never Smoker  . Smokeless tobacco: Never Used  Vaping Use  . Vaping Use: Never used  Substance and Sexual Activity  . Alcohol use: No  . Drug use: No  . Sexual activity: Yes    Birth control/protection: Pill  Other Topics Concern  . Not on file  Social History Narrative  . Not on file   Social Determinants of Health   Financial Resource Strain:   . Difficulty of Paying Living Expenses: Not on  file  Food Insecurity:   . Worried About Programme researcher, broadcasting/film/video in the Last Year: Not on file  . Ran Out of Food in the Last Year: Not on file  Transportation Needs:   . Lack of Transportation (Medical): Not on file  . Lack of Transportation (Non-Medical): Not on file  Physical Activity:   . Days of Exercise per Week: Not on file  . Minutes of Exercise per Session: Not on file  Stress:   . Feeling of Stress : Not on file  Social Connections:   . Frequency of Communication with Friends and Family: Not on file  . Frequency of Social Gatherings with Friends and Family: Not on file  . Attends Religious Services: Not on file  . Active Member of Clubs or Organizations: Not on file  . Attends Banker Meetings: Not on file  . Marital Status: Not on file  Intimate Partner Violence:   . Fear of Current or Ex-Partner: Not on file  . Emotionally Abused: Not on file  . Physically Abused: Not on file  . Sexually Abused: Not on file    Outpatient Medications Prior to Visit  Medication Sig Dispense Refill  . cloNIDine (CATAPRES) 0.3 MG tablet Take 1 tablet (0.3 mg total) by mouth 2 (two)  times daily. Take one tablet by mouth at 10 am and one tablet by mouth at 10 pm for blood pressure (Patient taking differently: Take 0.1 mg by mouth 2 (two) times daily. Take one tablet by mouth at 10 am and two tablets by mouth at 10 pm for blood pressure) 180 tablet 1  . Norethindrone Acetate-Ethinyl Estrad-FE (JUNEL FE 24) 1-20 MG-MCG(24) tablet Take 1 tablet by mouth daily. 3 Package 4  . VITAMIN D PO Take by mouth daily.    . meclizine (ANTIVERT) 25 MG tablet Take 1 tablet (25 mg total) by mouth 3 (three) times daily as needed for dizziness. (Patient not taking: Reported on 07/18/2020) 30 tablet 1  . JUNEL FE 1/20 1-20 MG-MCG tablet Take 1 tablet by mouth daily. (Patient not taking: Reported on 07/18/2020)     No facility-administered medications prior to visit.    Allergies  Allergen Reactions    . Amlodipine Other (See Comments)    Hair loss  . Prednisone     pill  . Shrimp [Shellfish Allergy]   . Sulfur   . Sulfamethoxazole Rash    ROS Review of Systems  Constitutional: Negative for chills and fever.  HENT: Negative for congestion, sinus pressure, sinus pain and sore throat.   Eyes: Negative for pain and discharge.  Respiratory: Negative for cough and shortness of breath.   Cardiovascular: Negative for chest pain and palpitations.  Gastrointestinal: Negative for abdominal pain, constipation, diarrhea, nausea and vomiting.  Endocrine: Negative for polydipsia and polyuria.  Genitourinary: Negative for dysuria and hematuria.  Musculoskeletal: Negative for neck pain and neck stiffness.  Skin: Negative for rash.  Neurological: Negative for dizziness, syncope, weakness and numbness.  Psychiatric/Behavioral: Negative for agitation and behavioral problems.      Objective:    Physical Exam Vitals reviewed.  Constitutional:      General: She is not in acute distress.    Appearance: She is obese. She is not diaphoretic.  HENT:     Head: Normocephalic and atraumatic.     Nose: Nose normal. No congestion.     Mouth/Throat:     Mouth: Mucous membranes are moist.     Pharynx: No posterior oropharyngeal erythema.  Eyes:     General: No scleral icterus.    Extraocular Movements: Extraocular movements intact.     Pupils: Pupils are equal, round, and reactive to light.  Cardiovascular:     Rate and Rhythm: Normal rate and regular rhythm.     Heart sounds: Normal heart sounds. No murmur heard.  No friction rub. No gallop.   Pulmonary:     Breath sounds: Normal breath sounds. No wheezing or rales.  Abdominal:     Palpations: Abdomen is soft.     Tenderness: There is no abdominal tenderness.  Musculoskeletal:     Cervical back: Neck supple. No rigidity or tenderness.     Right lower leg: No edema.     Left lower leg: No edema.  Skin:    General: Skin is warm.      Findings: No rash.  Neurological:     General: No focal deficit present.     Mental Status: She is alert and oriented to person, place, and time.     Sensory: No sensory deficit.     Motor: No weakness.  Psychiatric:        Mood and Affect: Mood normal.        Behavior: Behavior normal.     BP 138/86 (BP Location:  Left Arm, Patient Position: Sitting, Cuff Size: Normal)   Pulse 85   Temp (!) 97.3 F (36.3 C) (Temporal)   Resp 18   Ht 5' (1.524 m)   SpO2 100%   BMI 32.82 kg/m  Wt Readings from Last 3 Encounters:  03/29/20 168 lb 0.6 oz (76.2 kg)  11/26/19 160 lb (72.6 kg)  11/24/19 166 lb (75.3 kg)     Health Maintenance Due  Topic Date Due  . Hepatitis C Screening  Never done  . COLONOSCOPY  Never done    There are no preventive care reminders to display for this patient.  Lab Results  Component Value Date   TSH 3.108 09/04/2011   Lab Results  Component Value Date   WBC 4.9 09/04/2011   HGB 11.1 (L) 09/04/2011   HCT 33.0 (L) 09/04/2011   MCV 85.3 09/04/2011   PLT 229 09/04/2011   Lab Results  Component Value Date   NA 138 09/04/2011   K 4.2 09/04/2011   CO2 25 09/04/2011   GLUCOSE 89 09/04/2011   BUN 13 09/04/2011   CREATININE 0.86 09/04/2011   CALCIUM 8.8 09/04/2011   Lab Results  Component Value Date   CHOL 200 09/04/2011   Lab Results  Component Value Date   HDL 64 09/04/2011   Lab Results  Component Value Date   LDLCALC 128 (H) 09/04/2011   Lab Results  Component Value Date   TRIG 41 09/04/2011   Lab Results  Component Value Date   CHOLHDL 3.1 09/04/2011   Lab Results  Component Value Date   HGBA1C 5.8 (H) 09/04/2011      Assessment & Plan:   Problem List Items Addressed This Visit      Cardiovascular and Mediastinum   Essential hypertension - Primary    BP Readings from Last 1 Encounters:  07/18/20 138/86   Uncontrolled due to non-compliance Clonidine 0.1 mg QAM and 0.2 mg QPM Started HCTZ 25 mg QD Counseled for  compliance with the medications Advised DASH diet and moderate exercise/walking, at least 150 mins/week Check CMP in next visit       Relevant Medications   hydrochlorothiazide (HYDRODIURIL) 25 MG tablet     Other   Obese    Diet modification and moderate exercise discussed         Meds ordered this encounter  Medications  . hydrochlorothiazide (HYDRODIURIL) 25 MG tablet    Sig: Take 1 tablet (25 mg total) by mouth daily.    Dispense:  30 tablet    Refill:  3    Follow-up: Return if symptoms worsen or fail to improve.    Anabel Halon, MD

## 2020-07-18 NOTE — Assessment & Plan Note (Signed)
Diet modification and moderate exercise discussed 

## 2020-07-18 NOTE — Patient Instructions (Signed)
Please start taking Hydrochlorothiazide 25 mg once daily. Please continue to take Clonidine 0.1 mg in the morning and 0.2 mg in the evening.  Please follow DASH diet and moderate exercise at least 150 mins/week.  DASH stands for Dietary Approaches to Stop Hypertension. The DASH diet is a healthy-eating plan designed to help treat or prevent high blood pressure (hypertension).  The DASH diet includes foods that are rich in potassium, calcium and magnesium. These nutrients help control blood pressure. The diet limits foods that are high in sodium, saturated fat and added sugars.  Studies have shown that the DASH diet can lower blood pressure in as little as two weeks. The diet can also lower low-density lipoprotein (LDL or "bad") cholesterol levels in the blood. High blood pressure and high LDL cholesterol levels are two major risk factors for heart disease and stroke.    DASH diet: Recommended servings The DASH diet provides daily and weekly nutritional goals. The number of servings you should have depends on your daily calorie needs.  Here's a look at the recommended servings from each food group for a 2,000-calorie-a-day DASH diet:  Grains: 6 to 8 servings a day. One serving is one slice bread, 1 ounce dry cereal, or 1/2 cup cooked cereal, rice or pasta. Vegetables: 4 to 5 servings a day. One serving is 1 cup raw leafy green vegetable, 1/2 cup cut-up raw or cooked vegetables, or 1/2 cup vegetable juice. Fruits: 4 to 5 servings a day. One serving is one medium fruit, 1/2 cup fresh, frozen or canned fruit, or 1/2 cup fruit juice. Fat-free or low-fat dairy products: 2 to 3 servings a day. One serving is 1 cup milk or yogurt, or 1 1/2 ounces cheese. Lean meats, poultry and fish: six 1-ounce servings or fewer a day. One serving is 1 ounce cooked meat, poultry or fish, or 1 egg. Nuts, seeds and legumes: 4 to 5 servings a week. One serving is 1/3 cup nuts, 2 tablespoons peanut butter, 2 tablespoons  seeds, or 1/2 cup cooked legumes (dried beans or peas). Fats and oils: 2 to 3 servings a day. One serving is 1 teaspoon soft margarine, 1 teaspoon vegetable oil, 1 tablespoon mayonnaise or 2 tablespoons salad dressing. Sweets and added sugars: 5 servings or fewer a week. One serving is 1 tablespoon sugar, jelly or jam, 1/2 cup sorbet, or 1 cup lemonade.

## 2020-07-18 NOTE — Telephone Encounter (Signed)
Pls schedule appt, she needs to bring her meds and her BP cuff

## 2020-07-18 NOTE — Telephone Encounter (Signed)
Patient called stating the clonidine is causing light headiness and pressure behind her eyes since the increase. She dropped the dose back but now it isn't controlling her bp. She would like to know if the bp med can be changed.

## 2020-07-18 NOTE — Telephone Encounter (Signed)
Patient scheduled to see Dr.Patel today at 4pm

## 2020-07-18 NOTE — Assessment & Plan Note (Signed)
BP Readings from Last 1 Encounters:  07/18/20 138/86   Uncontrolled due to non-compliance Clonidine 0.1 mg QAM and 0.2 mg QPM Started HCTZ 25 mg QD Counseled for compliance with the medications Advised DASH diet and moderate exercise/walking, at least 150 mins/week Check CMP in next visit

## 2020-09-20 ENCOUNTER — Encounter: Payer: Self-pay | Admitting: Family Medicine

## 2020-09-20 ENCOUNTER — Telehealth: Payer: Self-pay

## 2020-09-20 NOTE — Telephone Encounter (Signed)
States she is not going to take anymore of the bp meds because it was making her hair come out worse than the amlodipine. She has appt scheduled for 1/6

## 2020-09-29 ENCOUNTER — Other Ambulatory Visit: Payer: Self-pay

## 2020-09-29 ENCOUNTER — Telehealth (INDEPENDENT_AMBULATORY_CARE_PROVIDER_SITE_OTHER): Payer: Commercial Managed Care - PPO | Admitting: Family Medicine

## 2020-09-29 ENCOUNTER — Encounter: Payer: Self-pay | Admitting: Family Medicine

## 2020-09-29 VITALS — BP 150/89

## 2020-09-29 DIAGNOSIS — L659 Nonscarring hair loss, unspecified: Secondary | ICD-10-CM | POA: Diagnosis not present

## 2020-09-29 DIAGNOSIS — E669 Obesity, unspecified: Secondary | ICD-10-CM

## 2020-09-29 DIAGNOSIS — I10 Essential (primary) hypertension: Secondary | ICD-10-CM

## 2020-09-29 DIAGNOSIS — E559 Vitamin D deficiency, unspecified: Secondary | ICD-10-CM | POA: Diagnosis not present

## 2020-09-29 DIAGNOSIS — E7849 Other hyperlipidemia: Secondary | ICD-10-CM

## 2020-09-29 DIAGNOSIS — Z1159 Encounter for screening for other viral diseases: Secondary | ICD-10-CM

## 2020-09-29 MED ORDER — MINOXIDIL 2.5 MG PO TABS: 2.5000 mg | ORAL_TABLET | Freq: Every day | ORAL | 2 refills | Status: DC

## 2020-09-29 NOTE — Progress Notes (Signed)
Virtual Visit via Telephone Note  I connected with Sarah Lutz on 09/29/20 at  1:40 PM EST by telephone and verified that I am speaking with the correct person using two identifiers.  Location: Patient: work Provider: office   I discussed the limitations, risks, security and privacy concerns of performing an evaluation and management service by telephone and the availability of in person appointments. I also discussed with the patient that there may be a patient responsible charge related to this service. The patient expressed understanding and agreed to proceed.   History of Present Illness: F/u hypertension. C/o excess hair loss on all bP meds prescribed, is aware that blood pressure is elevated, willing to take medication that will not cause hair loss ROS; CVS: denies chest pain, palpitations, PND, orthopnea or leg swelling Resp: denies cough or wheeze GI: denies abdominal pai , or change in BM, recently had normal colonoscopy MS: no joint pain, swelling or limitation in mobility   Observations/Objective: BP (!) 150/89  Good communication with no confusion and intact memory. Alert and oriented x 3 No signs of respiratory distress during speech  '  Assessment and Plan: Essential hypertension Uncontroled, start minoxili DASH diet and commitment to daily physical activity for a minimum of 30 minutes discussed and encouraged, as a part of hypertension management. The importance of attaining a healthy weight is also discussed.  BP/Weight 09/29/2020 07/18/2020 03/29/2020 11/26/2019 11/24/2019 09/28/2019 03/12/2019  Systolic BP 150 138 142 120 105 117 117  Diastolic BP 89 86 86 76 61 72 72  Wt. (Lbs) - - 168.04 160 166 154 154  BMI - 32.82 33.94 32.32 32.97 30.08 30.08      office eval in 8 to 10 weeks  Obese  Patient re-educated about  the importance of commitment to a  minimum of 150 minutes of exercise per week as able.  The importance of healthy food choices with portion  control discussed, as well as eating regularly and within a 12 hour window most days. The need to choose "clean , green" food 50 to 75% of the time is discussed, as well as to make water the primary drink and set a goal of 64 ounces water daily.    Weight /BMI 07/18/2020 03/29/2020 11/26/2019  WEIGHT - 168 lb 0.6 oz 160 lb  HEIGHT 5\' 0"  4\' 11"  4\' 11"   BMI 32.82 kg/m2 33.94 kg/m2 32.32 kg/m2       Alopecia Reports significant hair loss which she attributes to medication Has stopped chemicals in her hair, and I have advised to d/c braiding also, may wear wigs if desired Start minoxidil for blood pressure and alopecia    Follow Up Instructions:    I discussed the assessment and treatment plan with the patient. The patient was provided an opportunity to ask questions and all were answered. The patient agreed with the plan and demonstrated an understanding of the instructions.   The patient was advised to call back or seek an in-person evaluation if the symptoms worsen or if the condition fails to improve as anticipated.  I provided of non-face-to-face time during this encounter.   , MD

## 2020-09-29 NOTE — Patient Instructions (Signed)
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 If you develop chest pain, have severe difficulty breathing, or feel very tired, stop exercising immediately and seek medical attention  Think about what you will eat, plan ahead. Choose " clean, green, fresh or frozen" over canned, processed or packaged foods which are more sugary, salty and fatty. 70 to 75% of food eaten should be vegetables and fruit. Three meals at set times with snacks allowed between meals, but they must be fruit or vegetables. Aim to eat over a 12 hour period , example 7 am to 7 pm, and STOP after  your last meal of the day. Drink water,generally about 64 ounces per day, no other drink is as healthy. Fruit juice is best enjoyed in a healthy way, by EATING the fruit. Thanks for choosing [facility], we consider it a privelige to serve you.

## 2020-10-01 DIAGNOSIS — L659 Nonscarring hair loss, unspecified: Secondary | ICD-10-CM | POA: Insufficient documentation

## 2020-10-01 NOTE — Assessment & Plan Note (Signed)
Reports significant hair loss which she attributes to medication Has stopped chemicals in her hair, and I have advised to d/c braiding also, may wear wigs if desired Start minoxidil for blood pressure and alopecia

## 2020-10-01 NOTE — Assessment & Plan Note (Signed)
Uncontroled, start minoxili DASH diet and commitment to daily physical activity for a minimum of 30 minutes discussed and encouraged, as a part of hypertension management. The importance of attaining a healthy weight is also discussed.  BP/Weight 09/29/2020 07/18/2020 03/29/2020 11/26/2019 11/24/2019 09/28/2019 03/12/2019  Systolic BP 150 138 142 120 105 117 117  Diastolic BP 89 86 86 76 61 72 72  Wt. (Lbs) - - 168.04 160 166 154 154  BMI - 32.82 33.94 32.32 32.97 30.08 30.08      office eval in 8 to 10 weeks

## 2020-10-01 NOTE — Assessment & Plan Note (Signed)
  Patient re-educated about  the importance of commitment to a  minimum of 150 minutes of exercise per week as able.  The importance of healthy food choices with portion control discussed, as well as eating regularly and within a 12 hour window most days. The need to choose "clean , green" food 50 to 75% of the time is discussed, as well as to make water the primary drink and set a goal of 64 ounces water daily.    Weight /BMI 07/18/2020 03/29/2020 11/26/2019  WEIGHT - 168 lb 0.6 oz 160 lb  HEIGHT 5\' 0"  4\' 11"  4\' 11"   BMI 32.82 kg/m2 33.94 kg/m2 32.32 kg/m2

## 2020-10-19 ENCOUNTER — Telehealth: Payer: Self-pay | Admitting: Family Medicine

## 2020-10-19 MED ORDER — ERGOCALCIFEROL 1.25 MG (50000 UT) PO CAPS: 50000.0000 [IU] | ORAL_CAPSULE | ORAL | 1 refills | Status: DC

## 2020-10-19 NOTE — Telephone Encounter (Signed)
pls advise I have reviewed labs of 10/05/2020. Excellent except " bad cholesterol' high at 116, goal is 99 or less, alsoVit D low at 24.2 , I have prescribed once weekly vit D for 6 monhths

## 2020-10-20 NOTE — Telephone Encounter (Signed)
Sent via mychart

## 2020-11-24 ENCOUNTER — Other Ambulatory Visit: Payer: Self-pay

## 2020-11-24 ENCOUNTER — Ambulatory Visit (INDEPENDENT_AMBULATORY_CARE_PROVIDER_SITE_OTHER): Payer: Commercial Managed Care - PPO | Admitting: Adult Health

## 2020-11-24 ENCOUNTER — Encounter: Payer: Self-pay | Admitting: Adult Health

## 2020-11-24 VITALS — BP 128/80 | HR 83 | Ht 60.0 in | Wt 159.5 lb

## 2020-11-24 DIAGNOSIS — Z01419 Encounter for gynecological examination (general) (routine) without abnormal findings: Secondary | ICD-10-CM | POA: Diagnosis not present

## 2020-11-24 DIAGNOSIS — Z1211 Encounter for screening for malignant neoplasm of colon: Secondary | ICD-10-CM | POA: Diagnosis not present

## 2020-11-24 DIAGNOSIS — Z3041 Encounter for surveillance of contraceptive pills: Secondary | ICD-10-CM | POA: Diagnosis not present

## 2020-11-24 LAB — HEMOCCULT GUIAC POC 1CARD (OFFICE): Fecal Occult Blood, POC: NEGATIVE

## 2020-11-24 MED ORDER — JUNEL FE 24 1-20 MG-MCG(24) PO TABS: 1.0000 | ORAL_TABLET | Freq: Every day | ORAL | 4 refills | Status: DC

## 2020-11-24 NOTE — Progress Notes (Signed)
Patient ID: Sarah Lutz, female   DOB: March 30, 1971, 50 y.o.   MRN: [id number] History of Present Illness:  Sarah Lutz is a 50 year old black female,married, G0P0, in for a well woman gyn exam, she had a normal pap with negative HPV 11/24/19. 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 PCP is Dr Lodema Hong.   Current Medications, Allergies, Past Medical History, Past Surgical History, Family History and Social History were reviewed in [employer] record.     Review of Systems: Patient denies any headaches, hearing loss, fatigue, blurred vision, shortness of breath, chest pain, abdominal pain, problems with bowel movements, urination, or intercourse. No joint pain or mood swings. No periods on OCs.     Physical Exam:BP 128/80 (BP Location: Left Arm, Patient Position: Sitting, Cuff Size: Normal)   Pulse 83   Ht 5' (1.524 m)   Wt 159 lb 8 oz (72.3 kg)   BMI 31.15 kg/m  General:  Well developed, well nourished, no acute distress Skin:  Warm and dry Neck:  Midline trachea, normal thyroid, good ROM, no lymphadenopathy Lungs; Clear to auscultation bilaterally Breast:  No dominant palpable mass, retraction, or nipple discharge Cardiovascular: Regular rate and rhythm Abdomen:  Soft, non tender, no hepatosplenomegaly Pelvic:  External genitalia is normal in appearance, no lesions.  The vagina is normal in appearance. Urethra has no lesions or masses. The cervix is smooth.  Uterus is felt to be normal size, shape, and contour.  No adnexal masses or tenderness noted.Bladder is non tender, no masses felt. Rectal: Good sphincter tone, no polyps, or hemorrhoids felt.  Hemoccult negative. Extremities/musculoskeletal:  No swelling or varicosities noted, no clubbing or cyanosis Psych:  No mood changes, alert and cooperative,seems happy AA is 0  Fall risk is low PHQ 9 score is 0 GAD 7 score is 0  [facility] - 11/24/20 1349      Pregnancy Intention Screening   Does the  patient want to become pregnant in the next year? No    Does the patient's partner want to become pregnant in the next year? No    Would the patient like to discuss contraceptive options today? No      Contraception Wrap Up   Current Method Oral Contraceptive    End Method Oral Contraceptive    Contraception Counseling Provided No          Examination chaperoned by Malachy Mood LPN  Impression and Plan: 1. Encounter for well woman exam with routine gynecological exam Physical in 1 year Pap in 2024 Mammogram yearly Colonoscopy 2031 Labs at work   2. Encounter for surveillance of contraceptive pills Will continue OCs, and then stop next year and wait a month and check FSH Meds ordered this encounter  Medications  . Norethindrone Acetate-Ethinyl Estrad-FE (JUNEL FE 24) 1-20 MG-MCG(24) tablet    Sig: Take 1 tablet by mouth daily.    Dispense:  84 tablet    Refill:  4    Order Specific Question:   Supervising Provider    Answer:   Despina Hidden, LUTHER H [[id number]]    3. Encounter for screening fecal occult blood testing

## 2020-12-29 ENCOUNTER — Telehealth: Payer: Self-pay

## 2020-12-29 ENCOUNTER — Other Ambulatory Visit: Payer: Self-pay

## 2020-12-29 MED ORDER — MINOXIDIL 2.5 MG PO TABS: 2.5000 mg | ORAL_TABLET | Freq: Every day | ORAL | 2 refills | Status: DC

## 2020-12-29 NOTE — Telephone Encounter (Signed)
Rx sent in

## 2020-12-29 NOTE — Telephone Encounter (Signed)
Patient needing refills on minoxidil sent to [facility] in [place] ph# [phone]

## 2021-02-23 ENCOUNTER — Ambulatory Visit (INDEPENDENT_AMBULATORY_CARE_PROVIDER_SITE_OTHER): Payer: Commercial Managed Care - PPO

## 2021-02-23 ENCOUNTER — Other Ambulatory Visit: Payer: Self-pay

## 2021-02-23 DIAGNOSIS — Z23 Encounter for immunization: Secondary | ICD-10-CM | POA: Diagnosis not present

## 2021-04-17 ENCOUNTER — Telehealth: Payer: Self-pay

## 2021-04-17 ENCOUNTER — Other Ambulatory Visit: Payer: Self-pay

## 2021-04-17 DIAGNOSIS — I10 Essential (primary) hypertension: Secondary | ICD-10-CM

## 2021-04-17 MED ORDER — MINOXIDIL 2.5 MG PO TABS: 2.5000 mg | ORAL_TABLET | Freq: Every day | ORAL | 2 refills | Status: DC

## 2021-04-17 NOTE — Telephone Encounter (Signed)
Rx refilled.

## 2021-04-17 NOTE — Telephone Encounter (Signed)
Patient needing refill on minoxidil ph# [phone]

## 2021-06-13 ENCOUNTER — Other Ambulatory Visit: Payer: Self-pay | Admitting: Family Medicine

## 2021-06-13 DIAGNOSIS — Z1231 Encounter for screening mammogram for malignant neoplasm of breast: Secondary | ICD-10-CM

## 2021-07-11 ENCOUNTER — Ambulatory Visit
Admission: RE | Admit: 2021-07-11 | Discharge: 2021-07-11 | Disposition: A | Discharge status: Home / Self Care | Payer: Commercial Managed Care - PPO | Source: Ambulatory Visit | Attending: Family Medicine | Admitting: Family Medicine

## 2021-07-11 ENCOUNTER — Other Ambulatory Visit: Payer: Self-pay

## 2021-07-11 DIAGNOSIS — Z1231 Encounter for screening mammogram for malignant neoplasm of breast: Secondary | ICD-10-CM

## 2021-08-01 ENCOUNTER — Other Ambulatory Visit: Payer: Self-pay | Admitting: Family Medicine

## 2021-08-01 DIAGNOSIS — I10 Essential (primary) hypertension: Secondary | ICD-10-CM

## 2021-08-24 ENCOUNTER — Ambulatory Visit (INDEPENDENT_AMBULATORY_CARE_PROVIDER_SITE_OTHER): Payer: Commercial Managed Care - PPO | Admitting: *Deleted

## 2021-08-24 ENCOUNTER — Other Ambulatory Visit: Payer: Self-pay

## 2021-08-24 DIAGNOSIS — Z23 Encounter for immunization: Secondary | ICD-10-CM | POA: Diagnosis not present

## 2021-08-25 ENCOUNTER — Ambulatory Visit: Payer: Commercial Managed Care - PPO

## 2021-09-21 ENCOUNTER — Other Ambulatory Visit: Payer: Self-pay | Admitting: *Deleted

## 2021-09-21 ENCOUNTER — Telehealth: Payer: Self-pay | Admitting: Family Medicine

## 2021-09-21 DIAGNOSIS — I10 Essential (primary) hypertension: Secondary | ICD-10-CM

## 2021-09-21 MED ORDER — MINOXIDIL 2.5 MG PO TABS: 2.5000 mg | ORAL_TABLET | Freq: Every day | ORAL | 0 refills | Status: DC

## 2021-09-21 NOTE — Telephone Encounter (Signed)
Medication sent to pt pharmacy 

## 2021-09-21 NOTE — Telephone Encounter (Signed)
Please call in Minoxidil in

## 2021-09-29 ENCOUNTER — Ambulatory Visit: Payer: Commercial Managed Care - PPO | Admitting: Family Medicine

## 2021-10-05 ENCOUNTER — Ambulatory Visit (INDEPENDENT_AMBULATORY_CARE_PROVIDER_SITE_OTHER): Payer: Commercial Managed Care - PPO | Admitting: Family Medicine

## 2021-10-05 ENCOUNTER — Other Ambulatory Visit: Payer: Self-pay

## 2021-10-05 ENCOUNTER — Encounter: Payer: Self-pay | Admitting: Family Medicine

## 2021-10-05 VITALS — BP 158/90 | HR 78 | Resp 16 | Ht 60.0 in | Wt 150.4 lb

## 2021-10-05 DIAGNOSIS — E7849 Other hyperlipidemia: Secondary | ICD-10-CM

## 2021-10-05 DIAGNOSIS — I10 Essential (primary) hypertension: Secondary | ICD-10-CM | POA: Diagnosis not present

## 2021-10-05 DIAGNOSIS — E663 Overweight: Secondary | ICD-10-CM | POA: Diagnosis not present

## 2021-10-05 MED ORDER — MINOXIDIL 2.5 MG PO TABS: ORAL_TABLET | ORAL | 2 refills | Status: DC

## 2021-10-05 NOTE — Patient Instructions (Addendum)
F/U in 3. 5 months, call if you need me before  New higher dose of minoxidil is 2.5 mg TWO tablets once daily, blood pressure goal is 135/80 or less   Please reduce fried and fatty foods  Fasting lipid and chem 7 and EGFR 5 days before next visit ( Lab order through the [place], at [facility])  Thanks for choosing [facility], we consider it a privelige to serve you.

## 2021-10-10 ENCOUNTER — Encounter: Payer: Self-pay | Admitting: Family Medicine

## 2021-10-10 NOTE — Assessment & Plan Note (Signed)
°  Patient re-educated about  the importance of commitment to a  minimum of 150 minutes of exercise per week as able.  The importance of healthy food choices with portion control discussed, as well as eating regularly and within a 12 hour window most days. The need to choose "clean , green" food 50 to 75% of the time is discussed, as well as to make water the primary drink and set a goal of 64 ounces water daily.    Weight /BMI 10/05/2021 11/24/2020 07/18/2020  WEIGHT 150 lb 6.4 oz 159 lb 8 oz -  HEIGHT 5\' 0"  5\' 0"  5\' 0"   BMI 29.37 kg/m2 31.15 kg/m2 32.82 kg/m2

## 2021-10-10 NOTE — Progress Notes (Signed)
° °  Sarah Lutz     MRN: [id number]      DOB: 03/12/1971   HPI Sarah Lutz is here for follow up and re-evaluation of chronic medical conditions, medication management and review of any available recent lab and radiology data.  Preventive health is updated, specifically  Cancer screening and Immunization.   Questions or concerns regarding consultations or procedures which the PT has had in the interim are  addressed. The PT denies any adverse reactions to current medications since the last visit.  C/o work stress , and does note that at times BP s elevated Currently fasting at her church, has active lifestyle on the job   ROS Denies recent fever or chills. Denies sinus pressure, nasal congestion, ear pain or sore throat. Denies chest congestion, productive cough or wheezing. Denies chest pains, palpitations and leg swelling Denies abdominal pain, nausea, vomiting,diarrhea or constipation.   Denies dysuria, frequency, hesitancy or incontinence. Denies joint pain, swelling and limitation in mobility. Denies headaches, seizures, numbness, or tingling. Denies depression, anxiety or insomnia. Denies skin break down or rash.   PE  BP (!) 158/90    Pulse 78    Resp 16    Ht 5' (1.524 m)    Wt 150 lb 6.4 oz (68.2 kg)    SpO2 95%    BMI 29.37 kg/m   Patient alert and oriented and in no cardiopulmonary distress.  HEENT: No facial asymmetry, EOMI,     Neck supple .  Chest: Clear to auscultation bilaterally.  CVS: S1, S2 no murmurs, no S3.Regular rate.  ABD: Soft non tender.   Ext: No edema  MS: Adequate ROM spine, shoulders, hips and knees.  Skin: Intact, no ulcerations or rash noted.  Psych: Good eye contact, normal affect. Memory intact not anxious or depressed appearing.  CNS: CN 2-12 intact, power,  normal throughout.no focal deficits noted.   Assessment & Plan  Essential hypertension Uncontrolled, increase minoxidil dose, and re eval in next several months DASH  diet and commitment to daily physical activity for a minimum of 30 minutes discussed and encouraged, as a part of hypertension management. The importance of attaining a healthy weight is also discussed.  BP/Weight 10/05/2021 11/24/2020 09/29/2020 07/18/2020 03/29/2020 11/26/2019 11/24/2019  Systolic BP 158 128 150 138 142 120 105  Diastolic BP 90 80 89 86 86 76 61  Wt. (Lbs) 150.4 159.5 - - 168.04 160 166  BMI 29.37 31.15 - 32.82 33.94 32.32 32.97       Overweight (BMI 25.0-29.9)  Patient re-educated about  the importance of commitment to a  minimum of 150 minutes of exercise per week as able.  The importance of healthy food choices with portion control discussed, as well as eating regularly and within a 12 hour window most days. The need to choose "clean , green" food 50 to 75% of the time is discussed, as well as to make water the primary drink and set a goal of 64 ounces water daily.    Weight /BMI 10/05/2021 11/24/2020 07/18/2020  WEIGHT 150 lb 6.4 oz 159 lb 8 oz -  HEIGHT 5\' 0"  5\' 0"  5\' 0"   BMI 29.37 kg/m2 31.15 kg/m2 32.82 kg/m2      Hyperlipidemia Recent labs show marked elevation in total and bad cholesterol, low fat diet needed Updated lab needed at/ before next visit.

## 2021-10-10 NOTE — Assessment & Plan Note (Signed)
Recent labs show marked elevation in total and bad cholesterol, low fat diet needed Updated lab needed at/ before next visit.

## 2021-10-10 NOTE — Assessment & Plan Note (Signed)
Uncontrolled, increase minoxidil dose, and re eval in next several months DASH diet and commitment to daily physical activity for a minimum of 30 minutes discussed and encouraged, as a part of hypertension management. The importance of attaining a healthy weight is also discussed.  BP/Weight 10/05/2021 11/24/2020 09/29/2020 07/18/2020 03/29/2020 11/26/2019 11/24/2019  Systolic BP 158 128 150 138 142 120 105  Diastolic BP 90 80 89 86 86 76 61  Wt. (Lbs) 150.4 159.5 - - 168.04 160 166  BMI 29.37 31.15 - 32.82 33.94 32.32 32.97

## 2021-10-17 ENCOUNTER — Ambulatory Visit: Payer: Commercial Managed Care - PPO | Admitting: Family Medicine

## 2021-11-27 ENCOUNTER — Other Ambulatory Visit: Payer: Self-pay

## 2021-11-27 ENCOUNTER — Ambulatory Visit (INDEPENDENT_AMBULATORY_CARE_PROVIDER_SITE_OTHER): Payer: Commercial Managed Care - PPO | Admitting: Adult Health

## 2021-11-27 ENCOUNTER — Encounter: Payer: Self-pay | Admitting: Adult Health

## 2021-11-27 VITALS — BP 122/78 | HR 93 | Ht 60.0 in | Wt 151.0 lb

## 2021-11-27 DIAGNOSIS — Z1211 Encounter for screening for malignant neoplasm of colon: Secondary | ICD-10-CM

## 2021-11-27 DIAGNOSIS — Z01419 Encounter for gynecological examination (general) (routine) without abnormal findings: Secondary | ICD-10-CM | POA: Diagnosis not present

## 2021-11-27 DIAGNOSIS — Z3041 Encounter for surveillance of contraceptive pills: Secondary | ICD-10-CM

## 2021-11-27 LAB — HEMOCCULT GUIAC POC 1CARD (OFFICE): Fecal Occult Blood, POC: NEGATIVE

## 2021-11-27 MED ORDER — JUNEL FE 24 1-20 MG-MCG(24) PO TABS: 1.0000 | ORAL_TABLET | Freq: Every day | ORAL | 4 refills | Status: DC

## 2021-11-27 NOTE — Progress Notes (Signed)
Patient ID: Sarah Lutz, female   DOB: 07/16/1971, 51 y.o.   MRN: [id number] ?History of Present Illness: ?Sarah Lutz is a 51 year old black female,married,G0P0, in for a well woman gyn exam. 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 ? ?Lab Results  ?Component Value Date  ? DIAGPAP  11/24/2019  ?  - Negative for intraepithelial lesion or malignancy (NILM)  ? HPV NOT DETECTED 10/15/2016  ? HPVHIGH Negative 11/24/2019  ?  ?PCP is Dr Lodema Hong ? ?Current Medications, Allergies, Past Medical History, Past Surgical History, Family History and Social History were reviewed in [employer] record.   ? ? ?Review of Systems: ?Patient denies any headaches, hearing loss, fatigue, blurred vision, shortness of breath, chest pain, abdominal pain, problems with bowel movements, urination, or intercourse. No joint pain or mood swings.  ?No period on OCs ? ? ?Physical Exam:BP 122/78 (BP Location: Left Arm, Patient Position: Sitting, Cuff Size: Normal)   Pulse 93   Ht 5' (1.524 m)   Wt 151 lb (68.5 kg)   LMP  (LMP Unknown)   BMI 29.49 kg/m?   ?General:  Well developed, well nourished, no acute distress ?Skin:  Warm and dry ?Neck:  Midline trachea, normal thyroid, good ROM, no lymphadenopathy ?Lungs; Clear to auscultation bilaterally ?Breast:  No dominant palpable mass, retraction, or nipple discharge ?Cardiovascular: Regular rate and rhythm ?Abdomen:  Soft, non tender, no hepatosplenomegaly ?Pelvic:  External genitalia is normal in appearance, no lesions.  The vagina is normal in appearance. Urethra has no lesions or masses. The cervix is smooth.  Uterus is felt to be normal size, shape, and contour.  No adnexal masses or tenderness noted.Bladder is non tender, no masses felt. ?Rectal: Good sphincter tone, no polyps, or hemorrhoids felt.  Hemoccult negative. ?Extremities/musculoskeletal:  No swelling or varicosities noted,  no clubbing or cyanosis ?Psych:  No mood changes, alert and cooperative,seems happy ?AA is 0 ?Fall risk is low ?Depression screen [facility] 2/9 11/27/2021 11/24/2020 09/29/2020  ?Decreased Interest 0 0 0  ?Down, Depressed, Hopeless 0 0 0  ?PHQ - 2 Score 0 0 0  ?Altered sleeping 0 0 -  ?Tired, decreased energy 0 0 -  ?Change in appetite 0 0 -  ?Feeling bad or failure about yourself  0 0 -  ?Trouble concentrating 0 0 -  ?Moving slowly or fidgety/restless 0 0 -  ?Suicidal thoughts 0 0 -  ?PHQ-9 Score 0 0 -  ?  ?GAD 7 : Generalized Anxiety Score 11/27/2021 11/24/2020  ?Nervous, Anxious, on Edge 0 0  ?Control/stop worrying 0 0  ?Worry too much - different things 0 0  ?Trouble relaxing 0 0  ?Restless 0 0  ?Easily annoyed or irritable 0 0  ?Afraid - awful might happen 0 0  ?Total GAD 7 Score 0 0  ? ?  ? [facility] - 11/27/21 1547   ? ?  ? Pregnancy Intention Screening  ? Does the patient want to become pregnant in the next year? No   ? Does the patient's partner want to become pregnant in the next year? No   ? Would the patient like to discuss contraceptive options today? No   ?  ? Contraception Wrap Up  ? Current Method Oral Contraceptive   ? End Method Oral Contraceptive   ? Contraception Counseling Provided No   ? ?  ?  ? ?  ?  ?  Examination chaperoned by Faith Rogue LPN ? ?Impression and Plan: ?1. Encounter for well woman exam with routine gynecological exam ?Pap and physical next year ?Mammogram yearly ?Labs with PCP ?Colonoscopy per GI  ? ?2. Encounter for surveillance of contraceptive pills ?Will refill junel 1/20 ?Meds ordered this encounter  ?Medications  ? Norethindrone Acetate-Ethinyl Estrad-FE (JUNEL FE 24) 1-20 MG-MCG(24) tablet  ?  Sig: Take 1 tablet by mouth daily.  ?  Dispense:  84 tablet  ?  Refill:  4  ?  Order Specific Question:   Supervising Provider  ?  Answer:   Duane Lope H [[id number]]  ?  ? ?3. Encounter for screening fecal occult blood testing ? ? ? ? ?  ?  ?

## 2021-12-21 ENCOUNTER — Ambulatory Visit (INDEPENDENT_AMBULATORY_CARE_PROVIDER_SITE_OTHER): Payer: Commercial Managed Care - PPO | Admitting: Internal Medicine

## 2021-12-21 ENCOUNTER — Encounter: Payer: Self-pay | Admitting: Internal Medicine

## 2021-12-21 ENCOUNTER — Encounter: Payer: Self-pay | Admitting: Family Medicine

## 2021-12-21 DIAGNOSIS — H1032 Unspecified acute conjunctivitis, left eye: Secondary | ICD-10-CM | POA: Diagnosis not present

## 2021-12-21 MED ORDER — OFLOXACIN 0.3 % OP SOLN: 1.0000 [drp] | Freq: Four times a day (QID) | OPHTHALMIC | 0 refills | Status: DC

## 2021-12-21 NOTE — Progress Notes (Signed)
?  ? ?Virtual Visit via Telephone Note  ? ?This visit type was conducted due to national recommendations for restrictions regarding the COVID-19 Pandemic (e.g. social distancing) in an effort to limit this patient's exposure and mitigate transmission in our community.  Due to her co-morbid illnesses, this patient is at least at moderate risk for complications without adequate follow up.  This format is felt to be most appropriate for this patient at this time.  The patient did not have access to video technology/had technical difficulties with video requiring transitioning to audio format only (telephone).  All issues noted in this document were discussed and addressed.  No physical exam could be performed with this format. ? ?Evaluation Performed:  Follow-up visit ? ?Date:  12/21/2021  ? ?ID:  Sarah Lutz, DOB 22-Oct-1970, MRN [id number] ? ?Patient Location: Home ?Provider Location: Office/Clinic ? ?Participants: Patient ?Location of Patient: Home ?Location of Provider: Telehealth ?Consent was obtain for visit to be over via telehealth. ?I verified that I am speaking with the correct person using two identifiers. ? ?PCP:  Kerri Perches, MD  ? ?Chief Complaint: Left eye swelling ? ?History of Present Illness:   ? ?Sarah Lutz is a 51 y.o. female who has a televisit for left eye swelling and pain since yesterday.  She has tried taking OTC allergy medication for it.  She reports that her husband also had pinkeye (conjunctivitis), and was seen at urgent care and was given eyedrops for it.  She currently denies any eye discharge or visual disturbance. ? ?The patient does not have symptoms concerning for COVID-19 infection (fever, chills, cough, or new shortness of breath).  ? ?Past Medical, Surgical, Social History, Allergies, and Medications have been Reviewed. ? ?Past Medical History:  ?Diagnosis Date  ? Contraceptive management 09/29/2013  ? Hypertension   ? ?History reviewed. No pertinent surgical  history.  ? ?Current Meds  ?Medication Sig  ? cholecalciferol (VITAMIN D3) 25 MCG (1000 UNIT) tablet Take 1,000 Units by mouth daily.  ? EPINEPHrine 0.3 mg/0.3 mL IJ SOAJ injection Inject into the muscle.  ? minoxidil (LONITEN) 2.5 MG tablet Take two tablets by mouth once daily for blood pressure  ? Norethindrone Acetate-Ethinyl Estrad-FE (JUNEL FE 24) 1-20 MG-MCG(24) tablet Take 1 tablet by mouth daily.  ? ofloxacin (OCUFLOX) 0.3 % ophthalmic solution Place 1 drop into the left eye 4 (four) times daily.  ?  ? ?Allergies:   Amlodipine, Elemental sulfur, Prednisone, Shrimp [shellfish allergy], and Sulfamethoxazole  ? ?ROS:   ?Please see the history of present illness.    ? ?All other systems reviewed and are negative. ? ? ?Labs/Other Tests and Data Reviewed:   ? ?Recent Labs: ?No results found for requested labs within last 8760 hours.  ? ?Recent Lipid Panel ?Lab Results  ?Component Value Date/Time  ? CHOL 200 09/04/2011 01:54 PM  ? TRIG 41 09/04/2011 01:54 PM  ? HDL 64 09/04/2011 01:54 PM  ? CHOLHDL 3.1 09/04/2011 01:54 PM  ? LDLCALC 128 (H) 09/04/2011 01:54 PM  ? ? ?Wt Readings from Last 3 Encounters:  ?11/27/21 151 lb (68.5 kg)  ?10/05/21 150 lb 6.4 oz (68.2 kg)  ?11/24/20 159 lb 8 oz (72.3 kg)  ?  ? ?ASSESSMENT & PLAN:   ? ?Bacterial conjunctivitis ?Unilateral eye pain likely bacterial conjunctivitis ?She has shared picture of her eye through MyChart - although limited visibility ?Started ofloxacin eyedrop ?Advised to avoid itching or rubbing eyes ? ?Time:   ?Today, I have spent 7  minutes reviewing the chart, including problem list, medications, and with the patient with telehealth technology discussing the above problems. ? ? ?Medication Adjustments/Labs and Tests Ordered: ?Current medicines are reviewed at length with the patient today.  Concerns regarding medicines are outlined above.  ? ?Tests Ordered: ?No orders of the defined types were placed in this encounter. ? ? ?Medication Changes: ?Meds ordered this  encounter  ?Medications  ? ofloxacin (OCUFLOX) 0.3 % ophthalmic solution  ?  Sig: Place 1 drop into the left eye 4 (four) times daily.  ?  Dispense:  5 mL  ?  Refill:  0  ? ? ? ?Note: This dictation was prepared with Dragon dictation along with smaller phrase technology. Similar sounding words can be transcribed inadequately or may not be corrected upon review. Any transcriptional errors that result from this process are unintentional.  ?  ? ? ?Disposition:  Follow up  ?Signed, ?Anabel Halon, MD  ?12/21/2021 1:57 PM    ? ?[facility] ?[facility] ?

## 2022-01-17 ENCOUNTER — Ambulatory Visit: Payer: Commercial Managed Care - PPO | Admitting: Family Medicine

## 2022-03-26 IMAGING — MG MM DIGITAL SCREENING BILAT W/ TOMO AND CAD
8 series · 8 of 24 positions shown · non-contrast
Comparison: Previous exam(s).

CLINICAL DATA: Screening.

EXAM:
DIGITAL SCREENING BILATERAL MAMMOGRAM WITH TOMOSYNTHESIS AND CAD
TECHNIQUE: Bilateral screening digital craniocaudal and mediolateral oblique
mammograms were obtained. Bilateral screening digital breast
tomosynthesis was performed. The images were evaluated with
computer-aided detection.

[R MLO synth-2D]
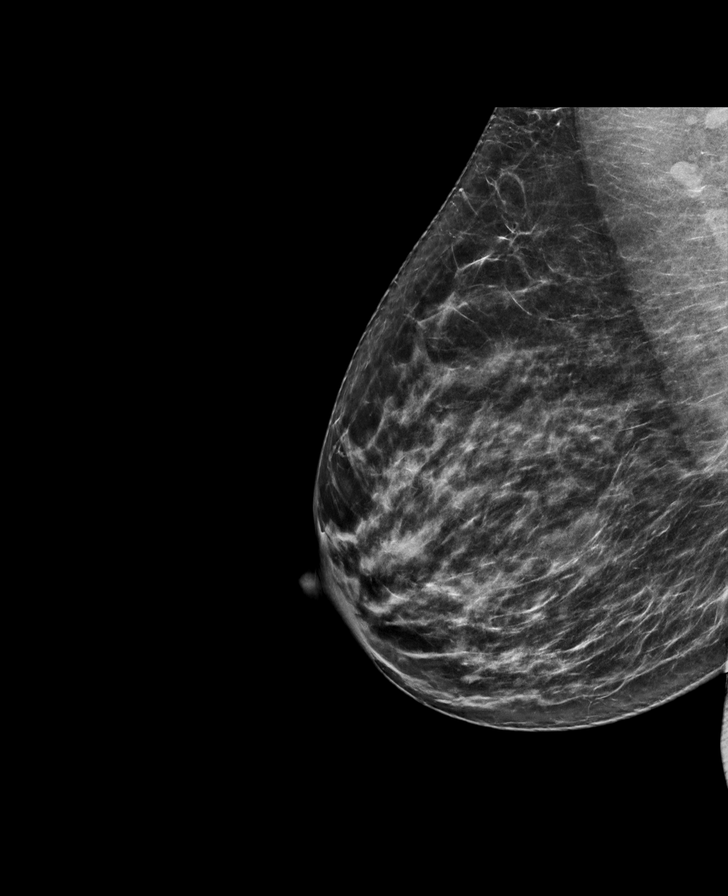

[R CC synth-2D]
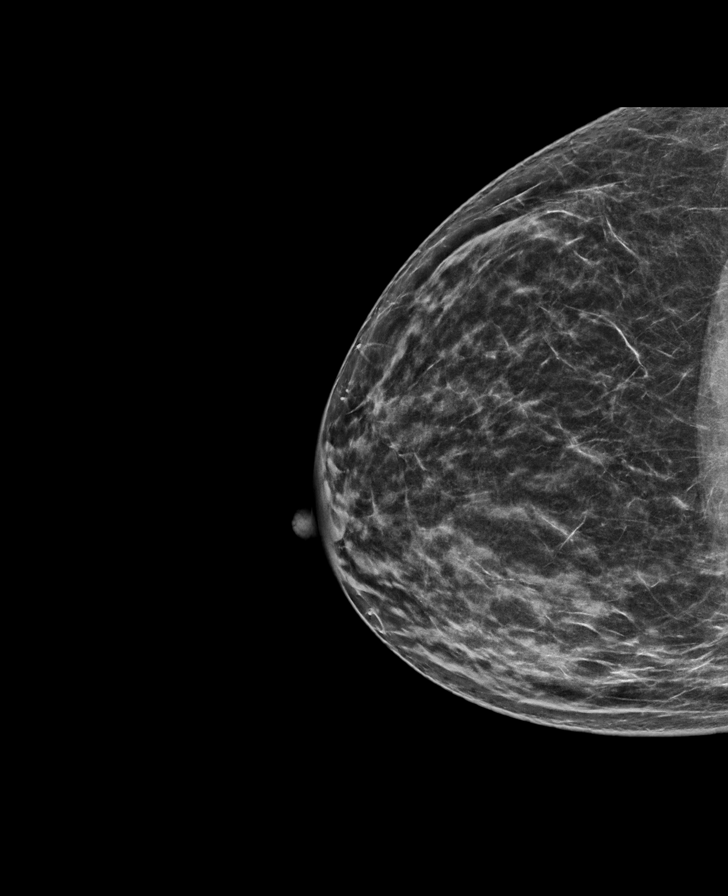

[L CC synth-2D]
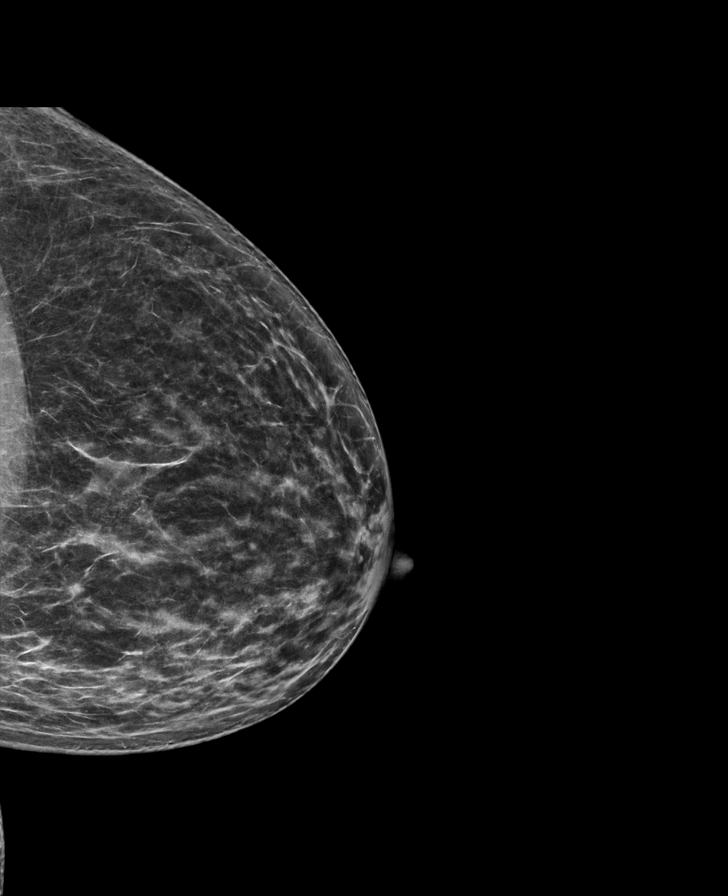

[L MLO synth-2D]
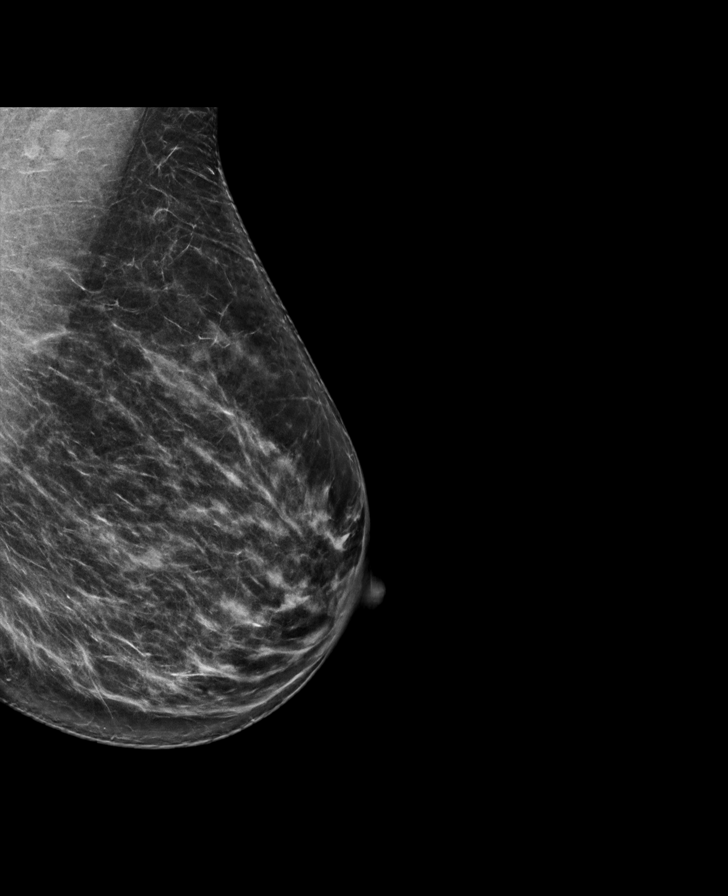

[R CC tomo · tomo slice 33/66.0]
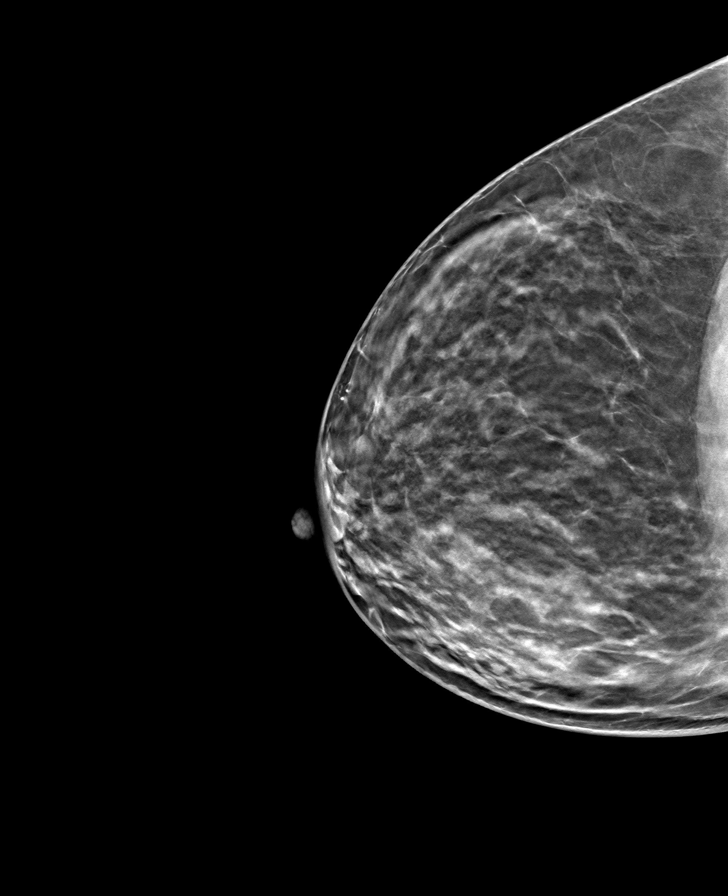

[L CC tomo · tomo slice 34/67.0]
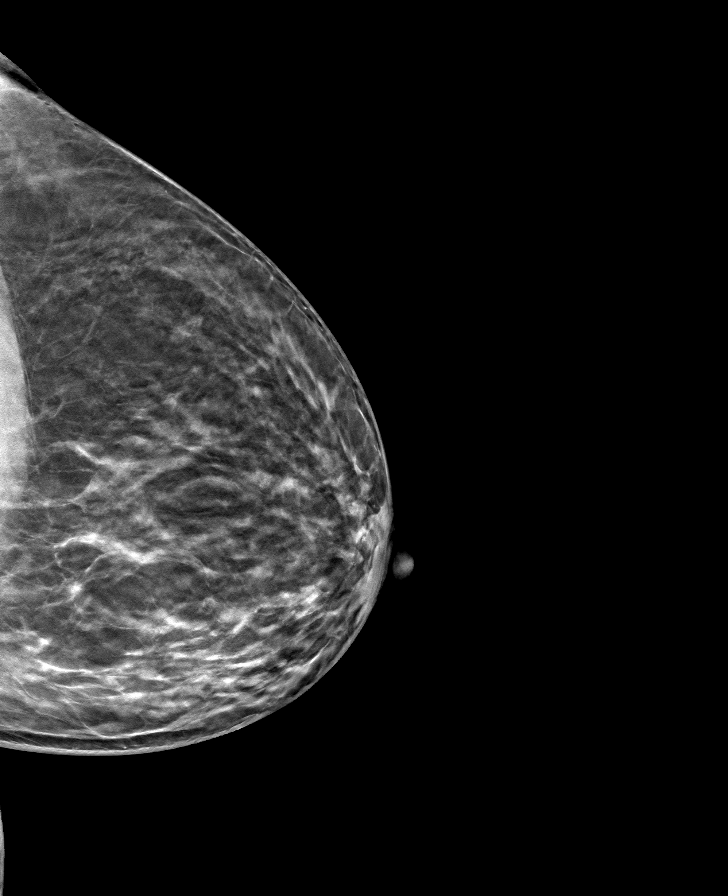

[L MLO tomo · tomo slice 36/71.0]
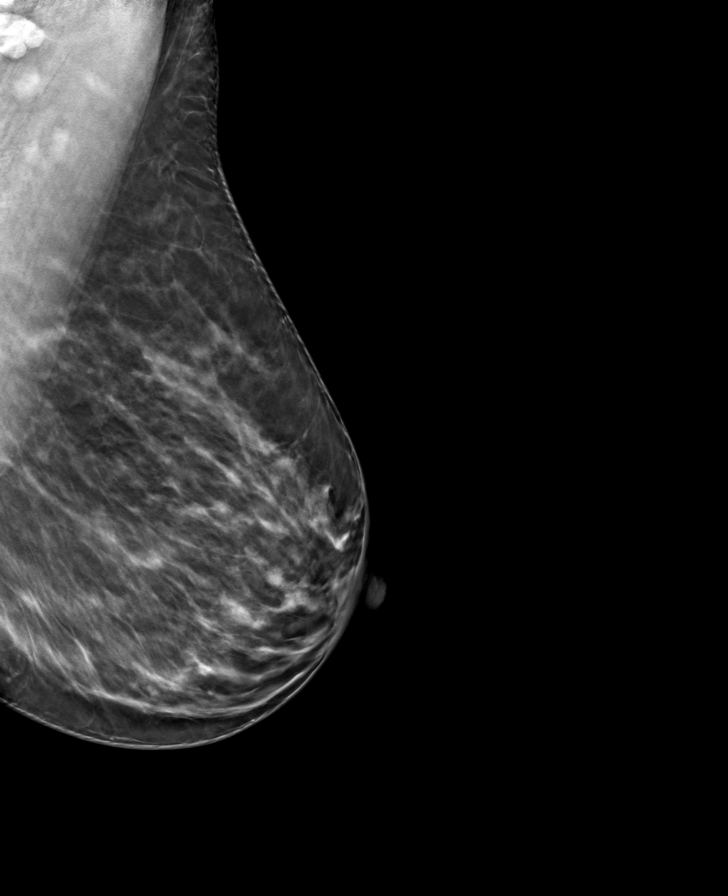

[R MLO tomo · tomo slice 37/72.0]
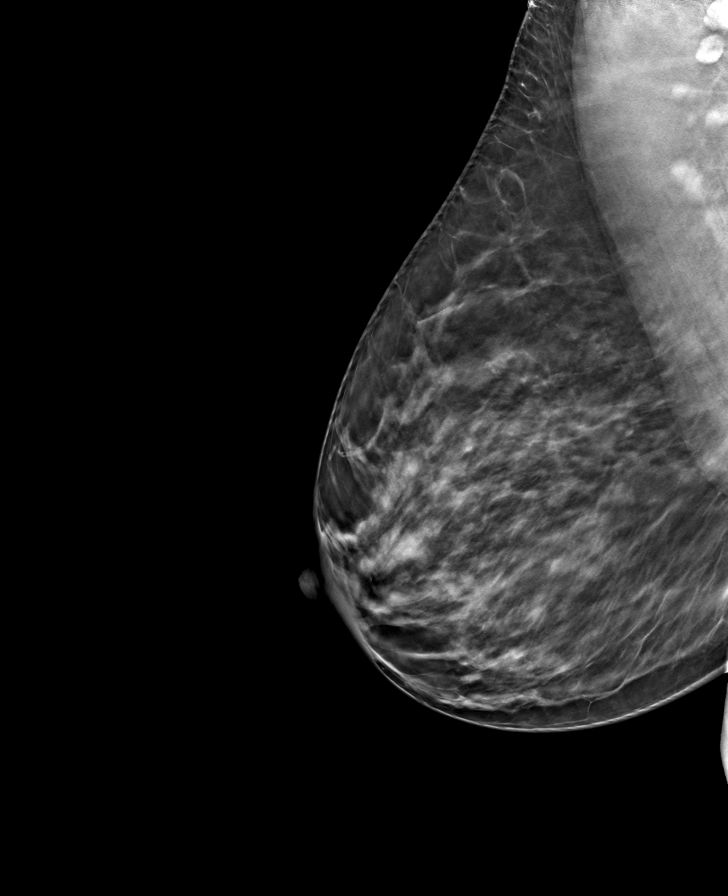

[8 of 24 positions shown; findings below may reference images not displayed]

ACR Breast Density Category c: The breast tissue is heterogeneously
dense, which may obscure small masses.
FINDINGS: There are no findings suspicious for malignancy.
IMPRESSION: No mammographic evidence of malignancy. A result letter of this
screening mammogram will be mailed directly to the patient.

RECOMMENDATION:
Screening mammogram in one year. (Code:Q3-W-BC3)

BI-RADS CATEGORY  1: Negative.

## 2022-04-20 ENCOUNTER — Other Ambulatory Visit: Payer: Self-pay | Admitting: Family Medicine

## 2022-05-29 ENCOUNTER — Ambulatory Visit (INDEPENDENT_AMBULATORY_CARE_PROVIDER_SITE_OTHER): Payer: Commercial Managed Care - PPO | Admitting: Family Medicine

## 2022-05-29 ENCOUNTER — Encounter: Payer: Self-pay | Admitting: Family Medicine

## 2022-05-29 VITALS — BP 124/84 | HR 77 | Ht 59.0 in | Wt 155.0 lb

## 2022-05-29 DIAGNOSIS — E7849 Other hyperlipidemia: Secondary | ICD-10-CM

## 2022-05-29 DIAGNOSIS — I10 Essential (primary) hypertension: Secondary | ICD-10-CM

## 2022-05-29 DIAGNOSIS — M79641 Pain in right hand: Secondary | ICD-10-CM | POA: Diagnosis not present

## 2022-05-29 DIAGNOSIS — Z1231 Encounter for screening mammogram for malignant neoplasm of breast: Secondary | ICD-10-CM | POA: Diagnosis not present

## 2022-05-29 DIAGNOSIS — M79642 Pain in left hand: Secondary | ICD-10-CM

## 2022-05-29 DIAGNOSIS — R7302 Impaired glucose tolerance (oral): Secondary | ICD-10-CM

## 2022-05-29 DIAGNOSIS — E663 Overweight: Secondary | ICD-10-CM

## 2022-05-29 MED ORDER — IBUPROFEN 800 MG PO TABS: ORAL_TABLET | ORAL | 0 refills | Status: DC

## 2022-05-29 MED ORDER — MINOXIDIL 2.5 MG PO TABS: ORAL_TABLET | ORAL | 3 refills | Status: DC

## 2022-05-29 NOTE — Assessment & Plan Note (Signed)
6 month history with right hand weakness, does repetitive work, positive weakness and wasting on exam, refer ortho

## 2022-05-29 NOTE — Assessment & Plan Note (Signed)
  Patient re-educated about  the importance of commitment to a  minimum of 150 minutes of exercise per week as able.  The importance of healthy food choices with portion control discussed, as well as eating regularly and within a 12 hour window most days. The need to choose "clean , green" food 50 to 75% of the time is discussed, as well as to make water the primary drink and set a goal of 64 ounces water daily.       05/29/2022   10:24 AM 11/27/2021    3:48 PM 10/05/2021    3:34 PM  Weight /BMI  Weight 155 lb 151 lb 150 lb 6.4 oz  Height 4\' 11"  (1.499 m) 5' (1.524 m) 5' (1.524 m)  BMI 31.31 kg/m2 29.49 kg/m2 29.37 kg/m2

## 2022-05-29 NOTE — Patient Instructions (Addendum)
F/u in early February, call if you need me sooner  Please cancel October visit  You are referred to Dr Amedeo Plenty re hand pain  Blood pressure is excellent  Please work on increased vegetable and fruit diet to facilitate weight loss  Please get fasting lipid panel , chem 7 and EGFR in next 1 week ( labs  through her job)  Please schedule mammogram at breast center at checkout  Thanks for choosing [facility], we consider it a privelige to serve you.  Monitor your " yawning " and  tiredness, need to be sure you do not have sleep apnea, which needs treatment . Call / message to let me know if this becomes a concern  Thanks for choosing [facility], we consider it a privelige to serve you.

## 2022-05-29 NOTE — Assessment & Plan Note (Signed)
Patient educated about the importance of limiting  Carbohydrate intake , the need to commit to daily physical activity for a minimum of 30 minutes , and to commit weight loss. The fact that changes in all these areas will reduce or eliminate all together the development of diabetes is stressed.  Updated lab needed at/ before next visit.      Latest Ref Rng & Units 09/04/2011    1:54 PM 06/30/2007    5:14 PM  Diabetic Labs  HbA1c <5.7 % 5.8    Chol 0 - 200 mg/dL 594  707   HDL >61 mg/dL 64  77   Calc LDL 0 - 99 mg/dL 518  343   Triglycerides <150 mg/dL 41  55   Creatinine 7.35 - 1.10 mg/dL 7.89  7.84       04/01/4127   10:24 AM 11/27/2021    3:48 PM 10/05/2021    4:01 PM 10/05/2021    3:34 PM 11/24/2020    1:44 PM 09/29/2020    1:13 PM 07/18/2020    4:09 PM  BP/Weight  Systolic BP 124 122 158 164 128 150 138  Diastolic BP 84 78 90 85 80 89 86  Wt. (Lbs) 155 151  150.4 159.5    BMI 31.31 kg/m2 29.49 kg/m2  29.37 kg/m2 31.15 kg/m2         No data to display

## 2022-05-29 NOTE — Assessment & Plan Note (Signed)
Controlled, no change in medication DASH diet and commitment to daily physical activity for a minimum of 30 minutes discussed and encouraged, as a part of hypertension management. The importance of attaining a healthy weight is also discussed.     05/29/2022   10:24 AM 11/27/2021    3:48 PM 10/05/2021    4:01 PM 10/05/2021    3:34 PM 11/24/2020    1:44 PM 09/29/2020    1:13 PM 07/18/2020    4:09 PM  BP/Weight  Systolic BP 124 122 158 164 128 150 138  Diastolic BP 84 78 90 85 80 89 86  Wt. (Lbs) 155 151  150.4 159.5    BMI 31.31 kg/m2 29.49 kg/m2  29.37 kg/m2 31.15 kg/m2

## 2022-05-29 NOTE — Progress Notes (Signed)
Sarah Lutz     MRN: [id number]      DOB: Jun 11, 1971   HPI Sarah Lutz is here for follow up and re-evaluation of chronic medical conditions, medication management and review of any available recent lab and radiology data.  Preventive health is updated, specifically  Cancer screening and Immunization.   Questions or concerns regarding consultations or procedures which the PT has had in the interim are  addressed. The PT denies any adverse reactions to current medications since the last visit.  8 month h/o  bilateral hand pain, tingling and numbness, weak in right hand  ROS Denies recent fever or chills. Denies sinus pressure, nasal congestion, ear pain or sore throat. Denies chest congestion, productive cough or wheezing. Denies chest pains, palpitations and leg swelling Denies abdominal pain, nausea, vomiting,diarrhea or constipation.   Denies dysuria, frequency, hesitancy or incontinence. . Denies headaches, seizures, numbness, or tingling. Denies depression, anxiety or insomnia. Denies skin break down or rash.   PE  BP 124/84 (BP Location: Right Arm, Patient Position: Sitting, Cuff Size: Large)   Pulse 77   Ht 4\' 11"  (1.499 m)   Wt 155 lb (70.3 kg)   SpO2 96%   BMI 31.31 kg/m   Patient alert and oriented and in no cardiopulmonary distress.  HEENT: No facial asymmetry, EOMI,     Neck supple .  Chest: Clear to auscultation bilaterally.  CVS: S1, S2 no murmurs, no S3.Regular rate.  ABD: Soft non tender.   Ext: No edema  MS: Adequate ROM spine, shoulders, hips and knees. Bilateral hand weakness, right greater than left with wasting of thenar eminence on right  Skin: Intact, no ulcerations or rash noted.  Psych: Good eye contact, normal affect. Memory intact not anxious or depressed appearing.  CNS: CN 2-12 intact, power,  normal throughout.no focal deficits noted.   Assessment & Plan  Essential hypertension Controlled, no change in medication DASH  diet and commitment to daily physical activity for a minimum of 30 minutes discussed and encouraged, as a part of hypertension management. The importance of attaining a healthy weight is also discussed.     05/29/2022   10:24 AM 11/27/2021    3:48 PM 10/05/2021    4:01 PM 10/05/2021    3:34 PM 11/24/2020    1:44 PM 09/29/2020    1:13 PM 07/18/2020    4:09 PM  BP/Weight  Systolic BP 124 122 158 164 128 150 138  Diastolic BP 84 78 90 85 80 89 86  Wt. (Lbs) 155 151  150.4 159.5    BMI 31.31 kg/m2 29.49 kg/m2  29.37 kg/m2 31.15 kg/m2         IGT (impaired glucose tolerance) Patient educated about the importance of limiting  Carbohydrate intake , the need to commit to daily physical activity for a minimum of 30 minutes , and to commit weight loss. The fact that changes in all these areas will reduce or eliminate all together the development of diabetes is stressed.  Updated lab needed at/ before next visit.      Latest Ref Rng & Units 09/04/2011    1:54 PM 06/30/2007    5:14 PM  Diabetic Labs  HbA1c <5.7 % 5.8    Chol 0 - 200 mg/dL 08/30/2007  580   HDL 998 mg/dL 64  77   Calc LDL 0 - 99 mg/dL >33  825   Triglycerides <150 mg/dL 41  55   Creatinine 053 - 1.10 mg/dL 9.76  0.99       05/29/2022   10:24 AM 11/27/2021    3:48 PM 10/05/2021    4:01 PM 10/05/2021    3:34 PM 11/24/2020    1:44 PM 09/29/2020    1:13 PM 07/18/2020    4:09 PM  BP/Weight  Systolic BP 124 122 158 164 128 150 138  Diastolic BP 84 78 90 85 80 89 86  Wt. (Lbs) 155 151  150.4 159.5    BMI 31.31 kg/m2 29.49 kg/m2  29.37 kg/m2 31.15 kg/m2         No data to display            Overweight (BMI 25.0-29.9)  Patient re-educated about  the importance of commitment to a  minimum of 150 minutes of exercise per week as able.  The importance of healthy food choices with portion control discussed, as well as eating regularly and within a 12 hour window most days. The need to choose "clean , green" food 50 to 75% of the time  is discussed, as well as to make water the primary drink and set a goal of 64 ounces water daily.       05/29/2022   10:24 AM 11/27/2021    3:48 PM 10/05/2021    3:34 PM  Weight /BMI  Weight 155 lb 151 lb 150 lb 6.4 oz  Height 4\' 11"  (1.499 m) 5' (1.524 m) 5' (1.524 m)  BMI 31.31 kg/m2 29.49 kg/m2 29.37 kg/m2      Bilateral hand pain 6 month history with right hand weakness, does repetitive work, positive weakness and wasting on exam, refer ortho

## 2022-05-30 ENCOUNTER — Other Ambulatory Visit: Payer: Self-pay | Admitting: Family Medicine

## 2022-05-30 DIAGNOSIS — Z1231 Encounter for screening mammogram for malignant neoplasm of breast: Secondary | ICD-10-CM

## 2022-07-12 ENCOUNTER — Ambulatory Visit: Payer: Commercial Managed Care - PPO | Admitting: Family Medicine

## 2022-07-12 ENCOUNTER — Ambulatory Visit
Admission: RE | Admit: 2022-07-12 | Discharge: 2022-07-12 | Disposition: A | Discharge status: Home / Self Care | Payer: Commercial Managed Care - PPO | Source: Ambulatory Visit | Attending: Family Medicine | Admitting: Family Medicine

## 2022-07-12 DIAGNOSIS — Z1231 Encounter for screening mammogram for malignant neoplasm of breast: Secondary | ICD-10-CM

## 2022-09-07 HISTORY — PX: CARPAL TUNNEL RELEASE: SHX101

## 2022-11-01 ENCOUNTER — Ambulatory Visit (INDEPENDENT_AMBULATORY_CARE_PROVIDER_SITE_OTHER): Payer: Commercial Managed Care - PPO | Admitting: Family Medicine

## 2022-11-01 ENCOUNTER — Encounter: Payer: Self-pay | Admitting: Family Medicine

## 2022-11-01 VITALS — BP 120/84 | HR 71 | Ht 59.0 in | Wt 152.0 lb

## 2022-11-01 DIAGNOSIS — M79642 Pain in left hand: Secondary | ICD-10-CM

## 2022-11-01 DIAGNOSIS — E7849 Other hyperlipidemia: Secondary | ICD-10-CM | POA: Diagnosis not present

## 2022-11-01 DIAGNOSIS — E559 Vitamin D deficiency, unspecified: Secondary | ICD-10-CM

## 2022-11-01 DIAGNOSIS — M79641 Pain in right hand: Secondary | ICD-10-CM

## 2022-11-01 DIAGNOSIS — I10 Essential (primary) hypertension: Secondary | ICD-10-CM

## 2022-11-01 DIAGNOSIS — L659 Nonscarring hair loss, unspecified: Secondary | ICD-10-CM

## 2022-11-01 DIAGNOSIS — E663 Overweight: Secondary | ICD-10-CM

## 2022-11-01 DIAGNOSIS — R7302 Impaired glucose tolerance (oral): Secondary | ICD-10-CM | POA: Diagnosis not present

## 2022-11-01 NOTE — Assessment & Plan Note (Signed)
Markedly improved has had bilateral carpal tunnel surgery

## 2022-11-01 NOTE — Assessment & Plan Note (Signed)
Controlled, no change in medication DASH diet and commitment to daily physical activity for a minimum of 30 minutes discussed and encouraged, as a part of hypertension management. The importance of attaining a healthy weight is also discussed.     11/01/2022    1:36 PM 11/01/2022    1:07 PM 11/01/2022    1:04 PM 05/29/2022   10:24 AM 11/27/2021    3:48 PM 10/05/2021    4:01 PM 10/05/2021    3:34 PM  BP/Weight  Systolic BP 829 562 130 865 784 696 295  Diastolic BP 84 80 81 84 78 90 85  Wt. (Lbs)   152.04 155 151  150.4  BMI   30.71 kg/m2 31.31 kg/m2 29.49 kg/m2  29.37 kg/m2

## 2022-11-01 NOTE — Progress Notes (Signed)
Sarah Lutz     MRN: [id number]      DOB: [birth year]   HPI Sarah Lutz is here for follow up and re-evaluation of chronic medical conditions, medication management and review of any available recent lab and radiology data.  Preventive health is updated, specifically  Cancer screening and Immunization.   Questions or concerns regarding consultations or procedures which the PT has had in the interim are  addressed.Happy with bilateral carpal tunnel surgery, also had injection for trigger finger, will return to work in next 3 weeks The PT denies any adverse reactions to current medications since the last visit.  Feels tired and overworked   ROS Denies recent fever or chills. Denies sinus pressure, nasal congestion, ear pain or sore throat. Denies chest congestion, productive cough or wheezing. Denies chest pains, palpitations and leg swelling Denies abdominal pain, nausea, vomiting,diarrhea or constipation.   Denies dysuria, frequency, hesitancy or incontinence. Denies joint pain, swelling and limitation in mobility. Denies headaches, seizures, numbness, or tingling. Denies depression, anxiety or insomnia. Denies skin break down or rash.   PE  BP 120/84   Pulse 71   Ht 4\' 11"  (1.499 m)   Wt 152 lb 0.6 oz (69 kg)   SpO2 97%   BMI 30.71 kg/m   Patient alert and oriented and in no cardiopulmonary distress.  HEENT: No facial asymmetry, EOMI,     Neck supple .  Chest: Clear to auscultation bilaterally.  CVS: S1, S2 no murmurs, no S3.Regular rate.  ABD: Soft non tender.   Ext: No edema  MS: Adequate ROM spine, shoulders, hips and knees.  Skin: Intact, no ulcerations or rash noted.  Psych: Good eye contact, normal affect. Memory intact not anxious or depressed appearing.  CNS: CN 2-12 intact, power,  normal throughout.no focal deficits noted.   Assessment & Plan  IGT (impaired glucose tolerance) Updated lab needed  Patient educated about the importance of  limiting  Carbohydrate intake , the need to commit to daily physical activity for a minimum of 30 minutes , and to commit weight loss. The fact that changes in all these areas will reduce or eliminate all together the development of diabetes is stressed.      Latest Ref Rng & Units 09/04/2011    1:54 PM 06/30/2007    5:14 PM  Diabetic Labs  HbA1c <5.7 % 5.8    Chol 0 - 200 mg/dL 200  221   HDL >39 mg/dL 64  77   Calc LDL 0 - 99 mg/dL 128  133   Triglycerides <150 mg/dL 41  55   Creatinine 0.50 - 1.10 mg/dL 0.86  0.99       11/01/2022    1:36 PM 11/01/2022    1:07 PM 11/01/2022    1:04 PM 05/29/2022   10:24 AM 11/27/2021    3:48 PM 10/05/2021    4:01 PM 10/05/2021    3:34 PM  BP/Weight  Systolic BP 253 664 403 474 259 563 875  Diastolic BP 84 80 81 84 78 90 85  Wt. (Lbs)   152.04 155 151  150.4  BMI   30.71 kg/m2 31.31 kg/m2 29.49 kg/m2  29.37 kg/m2       No data to display             Essential hypertension Controlled, no change in medication DASH diet and commitment to daily physical activity for a minimum of 30 minutes discussed and encouraged, as a part of hypertension management.  The importance of attaining a healthy weight is also discussed.     11/01/2022    1:36 PM 11/01/2022    1:07 PM 11/01/2022    1:04 PM 05/29/2022   10:24 AM 11/27/2021    3:48 PM 10/05/2021    4:01 PM 10/05/2021    3:34 PM  BP/Weight  Systolic BP 542 706 237 628 315 176 160  Diastolic BP 84 80 81 84 78 90 85  Wt. (Lbs)   152.04 155 151  150.4  BMI   30.71 kg/m2 31.31 kg/m2 29.49 kg/m2  29.37 kg/m2       Hyperlipidemia Hyperlipidemia:Low fat diet discussed and encouraged. Deteriorated, see values obtained in overview for 09/2022 Rept in 6 months change diet  Lipid Panel  Lab Results  Component Value Date   CHOL 200 09/04/2011   HDL 64 09/04/2011   LDLCALC 128 (H) 09/04/2011   TRIG 41 09/04/2011   CHOLHDL 3.1 09/04/2011     Updated lab needed at/ before next visit.   Overweight (BMI  25.0-29.9)  Patient re-educated about  the importance of commitment to a  minimum of 150 minutes of exercise per week as able.  The importance of healthy food choices with portion control discussed, as well as eating regularly and within a 12 hour window most days. The need to choose "clean , green" food 50 to 75% of the time is discussed, as well as to make water the primary drink and set a goal of 64 ounces water daily.       11/01/2022    1:04 PM 05/29/2022   10:24 AM 11/27/2021    3:48 PM  Weight /BMI  Weight 152 lb 0.6 oz 155 lb 151 lb  Height 4\' 11"  (1.499 m) 4\' 11"  (1.499 m) 5' (1.524 m)  BMI 30.71 kg/m2 31.31 kg/m2 29.49 kg/m2      Vitamin D deficiency Updated lab needed at/ before next visit. Had been off 2000 IU daily supplement for 3 weeks for surgery  Alopecia Improved/ resolved on minoxidil  Bilateral hand pain Markedly improved has had bilateral carpal tunnel surgery

## 2022-11-01 NOTE — Assessment & Plan Note (Addendum)
Hyperlipidemia:Low fat diet discussed and encouraged. Deteriorated, see values obtained in overview for 09/2022 Rept in 6 months change diet  Lipid Panel  Lab Results  Component Value Date   CHOL 200 09/04/2011   HDL 64 09/04/2011   LDLCALC 128 (H) 09/04/2011   TRIG 41 09/04/2011   CHOLHDL 3.1 09/04/2011     Updated lab needed at/ before next visit.

## 2022-11-01 NOTE — Assessment & Plan Note (Signed)
Updated lab needed at/ before next visit. Had been off 2000 IU daily supplement for 3 weeks for surgery

## 2022-11-01 NOTE — Patient Instructions (Signed)
F/U in 6 months, call if you need me sooner  Please reduce fried and fatty foods, NEED to reduce cheese  , butter and any fatty foods, reduce amount of salad dressing and do low fat dressing  Please get HBA1C as soon as possible and send result ( non fasting)  Need fasting lipid  , chem 7 and EGFr and  vit D in 6 month about 1 week before next appt  Need Covid vaccine  It is important that you exercise regularly at least 30 minutes 5 times a week. If you develop chest pain, have severe difficulty breathing, or feel very tired, stop exercising immediately and seek medical attention   Thanks for choosing [facility], we consider it a privelige to serve you.

## 2022-11-01 NOTE — Assessment & Plan Note (Signed)
Improved/ resolved on minoxidil

## 2022-11-01 NOTE — Assessment & Plan Note (Signed)
  Patient re-educated about  the importance of commitment to a  minimum of 150 minutes of exercise per week as able.  The importance of healthy food choices with portion control discussed, as well as eating regularly and within a 12 hour window most days. The need to choose "clean , green" food 50 to 75% of the time is discussed, as well as to make water the primary drink and set a goal of 64 ounces water daily.       11/01/2022    1:04 PM 05/29/2022   10:24 AM 11/27/2021    3:48 PM  Weight /BMI  Weight 152 lb 0.6 oz 155 lb 151 lb  Height 4\' 11"  (1.499 m) 4\' 11"  (1.499 m) 5' (1.524 m)  BMI 30.71 kg/m2 31.31 kg/m2 29.49 kg/m2

## 2022-11-01 NOTE — Assessment & Plan Note (Addendum)
Updated lab needed  Patient educated about the importance of limiting  Carbohydrate intake , the need to commit to daily physical activity for a minimum of 30 minutes , and to commit weight loss. The fact that changes in all these areas will reduce or eliminate all together the development of diabetes is stressed.      Latest Ref Rng & Units 09/04/2011    1:54 PM 06/30/2007    5:14 PM  Diabetic Labs  HbA1c <5.7 % 5.8    Chol 0 - 200 mg/dL 200  221   HDL >39 mg/dL 64  77   Calc LDL 0 - 99 mg/dL 128  133   Triglycerides <150 mg/dL 41  55   Creatinine 0.50 - 1.10 mg/dL 0.86  0.99       11/01/2022    1:36 PM 11/01/2022    1:07 PM 11/01/2022    1:04 PM 05/29/2022   10:24 AM 11/27/2021    3:48 PM 10/05/2021    4:01 PM 10/05/2021    3:34 PM  BP/Weight  Systolic BP 142 395 320 233 435 686 168  Diastolic BP 84 80 81 84 78 90 85  Wt. (Lbs)   152.04 155 151  150.4  BMI   30.71 kg/m2 31.31 kg/m2 29.49 kg/m2  29.37 kg/m2       No data to display

## 2022-11-29 ENCOUNTER — Ambulatory Visit: Payer: Commercial Managed Care - PPO | Admitting: Adult Health

## 2022-12-21 LAB — LAB REPORT - SCANNED: A1c: 5.6

## 2022-12-28 ENCOUNTER — Telehealth: Payer: Self-pay | Admitting: Family Medicine

## 2022-12-28 NOTE — Telephone Encounter (Signed)
P"ls advise I reviewed labs from 12/21/2022 All good EXCEPT cholesterol vERY high, I recommend low fat diet AND crestor 10 mg one daily, if she agree pls send in 30 with 5 refills and order fasting lipid, cmp and eGFr to be obtained 1 to 2  week BEFORE her August appt and she  brings lab work to appt d so this can be addressed at the time, [place] through the [place] I left labs in your are so you ca see them when you speak with her Thanks

## 2023-01-01 NOTE — Telephone Encounter (Signed)
LMTRC

## 2023-01-02 ENCOUNTER — Other Ambulatory Visit: Payer: Self-pay

## 2023-01-02 DIAGNOSIS — I10 Essential (primary) hypertension: Secondary | ICD-10-CM

## 2023-01-02 DIAGNOSIS — E7849 Other hyperlipidemia: Secondary | ICD-10-CM

## 2023-01-02 MED ORDER — ROSUVASTATIN CALCIUM 10 MG PO TABS: 10.0000 mg | ORAL_TABLET | Freq: Every day | ORAL | 3 refills | Status: DC

## 2023-01-02 NOTE — Telephone Encounter (Signed)
Patient aware of medication sent and labs ordered and mailed to patient

## 2023-01-02 NOTE — Telephone Encounter (Signed)
F/u   Returning call back

## 2023-01-22 ENCOUNTER — Encounter: Payer: Self-pay | Admitting: Adult Health

## 2023-01-22 ENCOUNTER — Other Ambulatory Visit (HOSPITAL_COMMUNITY)
Admission: RE | Admit: 2023-01-22 | Discharge: 2023-01-22 | Disposition: A | Discharge status: Home / Self Care | Payer: Commercial Managed Care - PPO | Source: Ambulatory Visit | Attending: Adult Health | Admitting: Adult Health

## 2023-01-22 ENCOUNTER — Ambulatory Visit (INDEPENDENT_AMBULATORY_CARE_PROVIDER_SITE_OTHER): Payer: Commercial Managed Care - PPO | Admitting: Adult Health

## 2023-01-22 VITALS — BP 132/82 | HR 89 | Ht 59.0 in | Wt 153.0 lb

## 2023-01-22 DIAGNOSIS — Z01419 Encounter for gynecological examination (general) (routine) without abnormal findings: Secondary | ICD-10-CM | POA: Insufficient documentation

## 2023-01-22 DIAGNOSIS — Z1211 Encounter for screening for malignant neoplasm of colon: Secondary | ICD-10-CM

## 2023-01-22 LAB — HEMOCCULT GUIAC POC 1CARD (OFFICE): Fecal Occult Blood, POC: NEGATIVE

## 2023-01-22 MED ORDER — EPINEPHRINE 0.3 MG/0.3ML IJ SOAJ: 0.3000 mg | INTRAMUSCULAR | 1 refills | Status: DC | PRN

## 2023-01-22 NOTE — Progress Notes (Signed)
Patient ID: Sarah Lutz, female   DOB: Jun 22, 1971, 52 y.o.   MRN: [id number] History of Present Illness: Sarah Lutz is a 52 year old black female,married, PM in for a well woman gyn exam and pap. She stopped her birth control pills over 8 months ago and no period. She requests EpiPen.  She is retiring in 11 months she says.  PCP is Dr Lodema Hong.   Current Medications, Allergies, Past Medical History, Past Surgical History, Family History and Social History were reviewed in [employer] record.     Review of Systems: Patient denies any headaches, hearing loss, fatigue, blurred vision, shortness of breath, chest pain, abdominal pain, problems with bowel movements, urination, or intercourse. No joint pain or mood swings.     Physical Exam:BP 132/82 (BP Location: Left Arm, Patient Position: Sitting, Cuff Size: Normal)   Pulse 89   Ht 4\' 11"  (1.499 m)   Wt 153 lb (69.4 kg)   LMP  (LMP Unknown)   BMI 30.90 kg/m   General:  Well developed, well nourished, no acute distress Skin:  Warm and dry Neck:  Midline trachea, normal thyroid, good ROM, no lymphadenopathy Lungs; Clear to auscultation bilaterally Breast:  No dominant palpable mass, retraction, or nipple discharge Cardiovascular: Regular rate and rhythm Abdomen:  Soft, non tender, no hepatosplenomegaly Pelvic:  External genitalia is normal in appearance, no lesions.  The vagina is normal in appearance. Urethra has no lesions or masses. The cervix is smooth, pap with HR HPV genotyping performed.  Uterus is felt to be normal size, shape, and contour.  No adnexal masses or tenderness noted.Bladder is non tender, no masses felt. Rectal: Good sphincter tone, no polyps, or hemorrhoids felt.  Hemoccult negative. Extremities/musculoskeletal:  No swelling or varicosities noted, no clubbing or cyanosis Psych:  No mood changes, alert and cooperative,seems happy AA is 0 Fall risk is low    01/22/2023    3:37 PM 11/01/2022     1:07 PM 05/29/2022   10:25 AM  Depression screen PHQ 2/9  Decreased Interest 0 0 0  Down, Depressed, Hopeless 0 0 0  PHQ - 2 Score 0 0 0  Altered sleeping 0    Tired, decreased energy 0    Change in appetite 0    Feeling bad or failure about yourself  0    Trouble concentrating 0    Moving slowly or fidgety/restless 0    Suicidal thoughts 0    PHQ-9 Score 0         01/22/2023    3:38 PM 11/01/2022    1:07 PM 11/27/2021    3:47 PM 11/24/2020    1:49 PM  GAD 7 : Generalized Anxiety Score  Nervous, Anxious, on Edge 0 0 0 0  Control/stop worrying 0 0 0 0  Worry too much - different things 0 0 0 0  Trouble relaxing 0 0 0 0  Restless 0 0 0 0  Easily annoyed or irritable 0 0 0 0  Afraid - awful might happen 0 0 0 0  Total GAD 7 Score 0 0 0 0  Anxiety Difficulty  Not difficult at all        Upstream - 01/22/23 1545       Pregnancy Intention Screening   Does the patient want to become pregnant in the next year? N/A    Does the patient's partner want to become pregnant in the next year? N/A    Would the patient like to discuss contraceptive  options today? N/A      Contraception Wrap Up   Current Method No Method - Other Reason   postmenopausal   Reason for No Current Contraceptive Method at Intake ([facility] Only) Other    End Method No Method - Other Reason   postmenopausal   Contraception Counseling Provided No             Examination chaperoned by Malachy Mood LPN  Impression and Plan: 1. Encounter for gynecological examination with Papanicolaou smear of cervix Pap sent Pap in 3 years if negative Physical in 1 year Labs with PCP Mammogram was negative 07/12/22 Colonoscopy per GI Will rx EpiPen Meds ordered this encounter  Medications   EPINEPHrine 0.3 mg/0.3 mL IJ SOAJ injection    Sig: Inject 0.3 mg into the muscle as needed for anaphylaxis.    Dispense:  1 each    Refill:  1    Order Specific Question:   Supervising Provider    Answer:   Lazaro Arms [[id number]]    -  Cytology - PAP( [place])  2. Encounter for screening fecal occult blood testing Hemoccult was negative  - POCT occult blood stool

## 2023-01-24 LAB — CYTOLOGY - PAP
Adequacy: ABSENT
Comment: NEGATIVE
Diagnosis: NEGATIVE
High risk HPV: NEGATIVE

## 2023-04-21 ENCOUNTER — Other Ambulatory Visit: Payer: Self-pay | Admitting: Family Medicine

## 2023-05-19 ENCOUNTER — Other Ambulatory Visit: Payer: Self-pay | Admitting: Family Medicine

## 2023-05-22 ENCOUNTER — Ambulatory Visit: Payer: Commercial Managed Care - PPO | Admitting: Family Medicine

## 2023-06-18 ENCOUNTER — Other Ambulatory Visit: Payer: Self-pay | Admitting: Family Medicine

## 2023-07-17 ENCOUNTER — Other Ambulatory Visit: Payer: Self-pay | Admitting: Family Medicine

## 2023-07-26 ENCOUNTER — Ambulatory Visit: Payer: Commercial Managed Care - PPO

## 2023-08-21 ENCOUNTER — Ambulatory Visit
Admission: RE | Admit: 2023-08-21 | Discharge: 2023-08-21 | Disposition: A | Discharge status: Home / Self Care | Payer: Commercial Managed Care - PPO | Source: Ambulatory Visit | Attending: Family Medicine | Admitting: Family Medicine

## 2023-08-25 ENCOUNTER — Other Ambulatory Visit: Payer: Self-pay | Admitting: Family Medicine

## 2023-09-06 ENCOUNTER — Other Ambulatory Visit: Payer: Self-pay | Admitting: Family Medicine

## 2023-09-06 MED ORDER — MINOXIDIL 2.5 MG PO TABS: ORAL_TABLET | ORAL | 2 refills | Status: DC

## 2023-09-07 LAB — LAB REPORT - SCANNED: EGFR: 68

## 2023-10-24 ENCOUNTER — Ambulatory Visit (INDEPENDENT_AMBULATORY_CARE_PROVIDER_SITE_OTHER): Payer: Commercial Managed Care - PPO | Admitting: Family Medicine

## 2023-10-24 ENCOUNTER — Encounter: Payer: Self-pay | Admitting: Family Medicine

## 2023-10-24 VITALS — BP 136/79 | HR 75 | Ht 59.0 in | Wt 158.1 lb

## 2023-10-24 DIAGNOSIS — M25562 Pain in left knee: Secondary | ICD-10-CM

## 2023-10-24 DIAGNOSIS — L659 Nonscarring hair loss, unspecified: Secondary | ICD-10-CM | POA: Diagnosis not present

## 2023-10-24 DIAGNOSIS — E663 Overweight: Secondary | ICD-10-CM

## 2023-10-24 DIAGNOSIS — D126 Benign neoplasm of colon, unspecified: Secondary | ICD-10-CM | POA: Insufficient documentation

## 2023-10-24 DIAGNOSIS — M79641 Pain in right hand: Secondary | ICD-10-CM

## 2023-10-24 DIAGNOSIS — M79642 Pain in left hand: Secondary | ICD-10-CM

## 2023-10-24 DIAGNOSIS — E559 Vitamin D deficiency, unspecified: Secondary | ICD-10-CM

## 2023-10-24 DIAGNOSIS — I10 Essential (primary) hypertension: Secondary | ICD-10-CM | POA: Diagnosis not present

## 2023-10-24 DIAGNOSIS — M25561 Pain in right knee: Secondary | ICD-10-CM

## 2023-10-24 NOTE — Patient Instructions (Addendum)
F/u in 12 months, call if you ned me sooner  Send message for fasting  labs in December   BP improved , goal is less than 130/80  Next colonoscopy due 07/2025  Pls schedule your Nov  28 or after mammogram  Weight loss goal of 10 pounds in 12 months, you will be healthier  Congrats on upcoming retirement  It is important that you exercise regularly at least 30 minutes 5 times a week. If you develop chest pain, have severe difficulty breathing, or feel very tired, stop exercising immediately and seek medical attention    Think about what you will eat, plan ahead. Choose " clean, green, fresh or frozen" over canned, processed or packaged foods which are more sugary, salty and fatty. 70 to 75% of food eaten should be vegetables and fruit. Three meals at set times with snacks allowed between meals, but they must be fruit or vegetables. Aim to eat over a 12 hour period , example 7 am to 7 pm, and STOP after  your last meal of the day. Drink water,generally about 64 ounces per day, no other drink is as healthy. Fruit juice is best enjoyed in a healthy way, by EATING the fruit.

## 2023-10-27 ENCOUNTER — Encounter: Payer: Self-pay | Admitting: Family Medicine

## 2023-10-27 NOTE — Assessment & Plan Note (Signed)
Improved with minoxidil, continue same

## 2023-10-27 NOTE — Assessment & Plan Note (Signed)
Right greater than left , no buckling , does not disturb sleep, no intervention needed except weight loss and strengthening exercise

## 2023-10-27 NOTE — Assessment & Plan Note (Signed)
OTC supplement of 2000 units daily is sufficient, mildly deficient

## 2023-10-27 NOTE — Assessment & Plan Note (Signed)
No report of new abdominal pain or change in stool caliber or habit Rept colonoscopy due in 2026 pt aware

## 2023-10-27 NOTE — Assessment & Plan Note (Signed)
  Patient re-educated about  the importance of commitment to a  minimum of 150 minutes of exercise per week as able.  The importance of healthy food choices with portion control discussed, as well as eating regularly and within a 12 hour window most days. The need to choose "clean , green" food 50 to 75% of the time is discussed, as well as to make water the primary drink and set a goal of 64 ounces water daily.       10/24/2023    3:06 PM 01/22/2023    3:41 PM 11/01/2022    1:04 PM  Weight /BMI  Weight 158 lb 1.9 oz 153 lb 152 lb 0.6 oz  Height 4\' 11"  (1.499 m) 4\' 11"  (1.499 m) 4\' 11"  (1.499 m)  BMI 31.94 kg/m2 30.9 kg/m2 30.71 kg/m2    Will work on 10 pound weight loss in next 1 year

## 2023-10-27 NOTE — Assessment & Plan Note (Signed)
DASH diet and commitment to daily physical activity for a minimum of 30 minutes discussed and encouraged, as a part of hypertension management. The importance of attaining a healthy weight is also discussed.     10/24/2023    3:06 PM 01/22/2023    3:41 PM 11/01/2022    1:36 PM 11/01/2022    1:07 PM 11/01/2022    1:04 PM 05/29/2022   10:24 AM 11/27/2021    3:48 PM  BP/Weight  Systolic BP 136 132 120 142 149 124 122  Diastolic BP 79 82 84 80 81 84 78  Wt. (Lbs) 158.12 153   152.04 155 151  BMI 31.94 kg/m2 30.9 kg/m2   30.71 kg/m2 31.31 kg/m2 29.49 kg/m2     No med change gaol for SBP less than 130, lifestyle change only

## 2023-10-27 NOTE — Assessment & Plan Note (Signed)
Resolved has had bilateral carpal tunnel surgery with success

## 2023-10-27 NOTE — Progress Notes (Signed)
Sarah Lutz     MRN: [id number]      DOB: Nov 15, 1970  Chief Complaint  Patient presents with   Follow-up    Follow up    HPI [name] is here for follow up and re-evaluation of chronic medical conditions, medication management and review of any available recent lab and radiology data.  Preventive health is updated, specifically  Cancer screening and Immunization.   Questions or concerns regarding consultations or procedures which the PT has had in the interim are  addressed. The PT denies any adverse reactions to current medications since the last visit.  There are no new concerns.  Retires from her job of 18 years and is looking forward to this ROS Denies recent fever or chills. Denies sinus pressure, nasal congestion, ear pain or sore throat. Denies chest congestion, productive cough or wheezing. Denies chest pains, palpitations and leg swelling Denies abdominal pain, nausea, vomiting,diarrhea or constipation.   Denies dysuria, frequency, hesitancy or incontinence. Denies uncontrolled  joint pain, swelling and limitation in mobility. Denies headaches, seizures, numbness, or tingling. Denies depression, anxiety or insomnia. Denies skin break down or rash.   PE  BP 136/79 (BP Location: Right Arm, Patient Position: Sitting, Cuff Size: Large)   Pulse 75   Ht 4\' 11"  (1.499 m)   Wt 158 lb 1.9 oz (71.7 kg)   LMP  (LMP Unknown)   SpO2 97%   BMI 31.94 kg/m   Patient alert and oriented and in no cardiopulmonary distress.  HEENT: No facial asymmetry, EOMI,     Neck supple .  Chest: Clear to auscultation bilaterally.  CVS: S1, S2 no murmurs, no S3.Regular rate.  Ext: No edema  MS: Adequate ROM spine, shoulders, hips and knees.  Skin: Intact, no ulcerations or rash noted.  Psych: Good eye contact, normal affect. Memory intact not anxious or depressed appearing.  CNS: CN 2-12 intact, power,  normal throughout.no focal deficits noted.   Assessment &  Plan  Essential hypertension DASH diet and commitment to daily physical activity for a minimum of 30 minutes discussed and encouraged, as a part of hypertension management. The importance of attaining a healthy weight is also discussed.     10/24/2023    3:06 PM 01/22/2023    3:41 PM 11/01/2022    1:36 PM 11/01/2022    1:07 PM 11/01/2022    1:04 PM 05/29/2022   10:24 AM 11/27/2021    3:48 PM  BP/Weight  Systolic BP 136 132 120 142 149 124 122  Diastolic BP 79 82 84 80 81 84 78  Wt. (Lbs) 158.12 153   152.04 155 151  BMI 31.94 kg/m2 30.9 kg/m2   30.71 kg/m2 31.31 kg/m2 29.49 kg/m2     No med change gaol for SBP less than 130, lifestyle change only  Colon adenoma No report of new abdominal pain or change in stool caliber or habit Rept colonoscopy due in 2026 pt aware  Bilateral hand pain Resolved has had bilateral carpal tunnel surgery with success  Arthralgia of both knees Right greater than left , no buckling , does not disturb sleep, no intervention needed except weight loss and strengthening exercise  Alopecia Improved with minoxidil, continue same  Overweight (BMI 25.0-29.9)  Patient re-educated about  the importance of commitment to a  minimum of 150 minutes of exercise per week as able.  The importance of healthy food choices with portion control discussed, as well as eating regularly and within a 12 hour window  most days. The need to choose "clean , green" food 50 to 75% of the time is discussed, as well as to make water the primary drink and set a goal of 64 ounces water daily.       10/24/2023    3:06 PM 01/22/2023    3:41 PM 11/01/2022    1:04 PM  Weight /BMI  Weight 158 lb 1.9 oz 153 lb 152 lb 0.6 oz  Height 4\' 11"  (1.499 m) 4\' 11"  (1.499 m) 4\' 11"  (1.499 m)  BMI 31.94 kg/m2 30.9 kg/m2 30.71 kg/m2    Will work on 10 pound weight loss in next 1 year  Vitamin D deficiency OTC supplement of 2000 units daily is sufficient, mildly deficient

## 2024-01-04 ENCOUNTER — Other Ambulatory Visit: Payer: Self-pay | Admitting: Family Medicine

## 2024-02-02 ENCOUNTER — Other Ambulatory Visit: Payer: Self-pay | Admitting: Family Medicine

## 2024-03-01 ENCOUNTER — Other Ambulatory Visit: Payer: Self-pay | Admitting: Family Medicine

## 2024-03-31 ENCOUNTER — Other Ambulatory Visit: Payer: Self-pay | Admitting: Family Medicine

## 2024-04-29 ENCOUNTER — Other Ambulatory Visit: Payer: Self-pay | Admitting: Family Medicine

## 2024-05-31 ENCOUNTER — Other Ambulatory Visit: Payer: Self-pay | Admitting: Family Medicine

## 2024-06-26 ENCOUNTER — Other Ambulatory Visit: Payer: Self-pay | Admitting: Family Medicine

## 2024-06-26 MED ORDER — EPINEPHRINE 0.3 MG/0.3ML IJ SOAJ: 0.3000 mg | INTRAMUSCULAR | 1 refills | Status: DC | PRN

## 2024-06-26 NOTE — Telephone Encounter (Signed)
 Copied from CRM [id number]. Topic: Clinical - Medication Refill >> Jun 26, 2024  9:01 AM Ivette P wrote: Medication: EPINEPHrine  0.3 mg/0.3 mL IJ SOAJ injection  Has the patient contacted their pharmacy? No (Agent: If no, request that the patient contact the pharmacy for the refill. If patient does not wish to contact the pharmacy document the reason why and proceed with request.) (Agent: If yes, when and what did the pharmacy advise?)  This is the patient's preferred pharmacy:  [facility] [address], [place] - [address] [place] [address] Phone: [phone] Fax: [phone]  Is this the correct pharmacy for this prescription? Yes If no, delete pharmacy and type the correct one.   Has the prescription been filled recently? No  Is the patient out of the medication? Yes  Has the patient been seen for an appointment in the last year OR does the patient have an upcoming appointment? Yes  Can we respond through MyChart? Yes  Agent: Please be advised that Rx refills may take up to 3 business days. We ask that you follow-up with your pharmacy.

## 2024-07-04 ENCOUNTER — Other Ambulatory Visit: Payer: Self-pay | Admitting: Family Medicine

## 2024-07-10 ENCOUNTER — Other Ambulatory Visit: Payer: Self-pay | Admitting: Family Medicine

## 2024-07-10 DIAGNOSIS — Z1231 Encounter for screening mammogram for malignant neoplasm of breast: Secondary | ICD-10-CM

## 2024-07-31 ENCOUNTER — Other Ambulatory Visit: Payer: Self-pay | Admitting: Family Medicine

## 2024-08-24 ENCOUNTER — Ambulatory Visit

## 2024-08-25 ENCOUNTER — Ambulatory Visit
Admission: RE | Admit: 2024-08-25 | Discharge: 2024-08-25 | Disposition: A | Source: Ambulatory Visit | Attending: Family Medicine | Admitting: Family Medicine

## 2024-08-25 DIAGNOSIS — Z1231 Encounter for screening mammogram for malignant neoplasm of breast: Secondary | ICD-10-CM

## 2024-08-30 ENCOUNTER — Other Ambulatory Visit: Payer: Self-pay | Admitting: Family Medicine

## 2024-09-30 ENCOUNTER — Other Ambulatory Visit: Payer: Self-pay | Admitting: Family Medicine

## 2024-10-13 ENCOUNTER — Other Ambulatory Visit: Payer: Self-pay

## 2024-10-13 ENCOUNTER — Telehealth: Payer: Self-pay

## 2024-10-13 DIAGNOSIS — R7302 Impaired glucose tolerance (oral): Secondary | ICD-10-CM

## 2024-10-13 DIAGNOSIS — E559 Vitamin D deficiency, unspecified: Secondary | ICD-10-CM

## 2024-10-13 DIAGNOSIS — E7849 Other hyperlipidemia: Secondary | ICD-10-CM

## 2024-10-13 DIAGNOSIS — I10 Essential (primary) hypertension: Secondary | ICD-10-CM

## 2024-10-13 NOTE — Telephone Encounter (Signed)
 Copied from CRM [id number]. Topic: General - Other >> Oct 13, 2024 11:13 AM Tonda B wrote: Reason for CRM: patient is needing blood work orde before 10/23/2024 call pt back [phone]

## 2024-10-13 NOTE — Telephone Encounter (Signed)
 Pt informed

## 2024-10-13 NOTE — Telephone Encounter (Signed)
 Fasting CBC, lipid, cmp and EGFr, HBA1C, TSH and vit D

## 2024-10-14 ENCOUNTER — Ambulatory Visit: Payer: Self-pay | Admitting: Family Medicine

## 2024-10-14 LAB — CBC WITH DIFFERENTIAL/PLATELET
Basophils Absolute: 0 x10E3/uL (ref 0.0–0.2)
Basos: 1 %
EOS (ABSOLUTE): 0.1 x10E3/uL (ref 0.0–0.4)
Eos: 1 %
Hematocrit: 36.7 % (ref 34.0–46.6)
Hemoglobin: 11.9 g/dL (ref 11.1–15.9)
Immature Grans (Abs): 0 x10E3/uL (ref 0.0–0.1)
Immature Granulocytes: 0 %
Lymphocytes Absolute: 2.6 x10E3/uL (ref 0.7–3.1)
Lymphs: 56 %
MCH: 27.5 pg (ref 26.6–33.0)
MCHC: 32.4 g/dL (ref 31.5–35.7)
MCV: 85 fL (ref 79–97)
Monocytes Absolute: 0.3 x10E3/uL (ref 0.1–0.9)
Monocytes: 6 %
Neutrophils Absolute: 1.7 x10E3/uL (ref 1.4–7.0)
Neutrophils: 36 %
Platelets: 226 x10E3/uL (ref 150–450)
RBC: 4.32 x10E6/uL (ref 3.77–5.28)
RDW: 14.2 % (ref 11.7–15.4)
WBC: 4.6 x10E3/uL (ref 3.4–10.8)

## 2024-10-14 LAB — CMP14+EGFR
ALT: 10 IU/L (ref 0–32)
AST: 17 IU/L (ref 0–40)
Albumin: 4.5 g/dL (ref 3.8–4.9)
Alkaline Phosphatase: 86 IU/L (ref 49–135)
BUN/Creatinine Ratio: 21 (ref 9–23)
BUN: 17 mg/dL (ref 6–24)
Bilirubin Total: 0.3 mg/dL (ref 0.0–1.2)
CO2: 23 mmol/L (ref 20–29)
Calcium: 9.2 mg/dL (ref 8.7–10.2)
Chloride: 103 mmol/L (ref 96–106)
Creatinine, Ser: 0.81 mg/dL (ref 0.57–1.00)
Globulin, Total: 2.6 g/dL (ref 1.5–4.5)
Glucose: 91 mg/dL (ref 70–99)
Potassium: 3.8 mmol/L (ref 3.5–5.2)
Sodium: 140 mmol/L (ref 134–144)
Total Protein: 7.1 g/dL (ref 6.0–8.5)
eGFR: 86 mL/min/1.73

## 2024-10-14 LAB — HEMOGLOBIN A1C
Est. average glucose Bld gHb Est-mCnc: 120 mg/dL
Hgb A1c MFr Bld: 5.8 % — ABNORMAL HIGH (ref 4.8–5.6)

## 2024-10-14 LAB — TSH: TSH: 1.15 u[IU]/mL (ref 0.450–4.500)

## 2024-10-14 LAB — LIPID PANEL
Chol/HDL Ratio: 3 ratio (ref 0.0–4.4)
Cholesterol, Total: 226 mg/dL — ABNORMAL HIGH (ref 100–199)
HDL: 76 mg/dL
LDL Chol Calc (NIH): 144 mg/dL — ABNORMAL HIGH (ref 0–99)
Triglycerides: 37 mg/dL (ref 0–149)
VLDL Cholesterol Cal: 6 mg/dL (ref 5–40)

## 2024-10-14 LAB — VITAMIN D 25 HYDROXY (VIT D DEFICIENCY, FRACTURES): Vit D, 25-Hydroxy: 20.8 ng/mL — ABNORMAL LOW (ref 30.0–100.0)

## 2024-10-23 ENCOUNTER — Encounter: Payer: Self-pay | Admitting: Family Medicine

## 2024-10-23 ENCOUNTER — Ambulatory Visit: Payer: Commercial Managed Care - PPO | Admitting: Family Medicine

## 2024-10-23 VITALS — BP 130/86 | HR 68 | Ht 59.0 in | Wt 162.0 lb

## 2024-10-23 DIAGNOSIS — E7800 Pure hypercholesterolemia, unspecified: Secondary | ICD-10-CM

## 2024-10-23 DIAGNOSIS — D126 Benign neoplasm of colon, unspecified: Secondary | ICD-10-CM | POA: Diagnosis not present

## 2024-10-23 DIAGNOSIS — E66811 Obesity, class 1: Secondary | ICD-10-CM

## 2024-10-23 DIAGNOSIS — I10 Essential (primary) hypertension: Secondary | ICD-10-CM

## 2024-10-23 DIAGNOSIS — R7302 Impaired glucose tolerance (oral): Secondary | ICD-10-CM

## 2024-10-23 MED ORDER — ROSUVASTATIN CALCIUM 10 MG PO TABS: 10.0000 mg | ORAL_TABLET | Freq: Every day | ORAL | 1 refills | Status: AC

## 2024-10-23 MED ORDER — EPINEPHRINE 0.3 MG/0.3ML IJ SOAJ: 0.3000 mg | INTRAMUSCULAR | 1 refills | Status: AC | PRN

## 2024-10-23 MED ORDER — MINOXIDIL 2.5 MG PO TABS: 5.0000 mg | ORAL_TABLET | Freq: Every day | ORAL | 1 refills | Status: AC

## 2024-10-23 NOTE — Patient Instructions (Addendum)
 F/U in 6 months  Fasting lipid, cmp and egFR and HBA1c   It is important that you exercise regularly at least 30 minutes 5 times a week. If you develop chest pain, have severe difficulty breathing, or feel very tired, stop exercising immediately and seek medical attention    Think about what you will eat, plan ahead. Choose  clean, green, fresh or frozen over canned, processed or packaged foods which are more sugary, salty and fatty. 70 to 75% of food eaten should be vegetables and fruit. Three meals at set times with snacks allowed between meals, but they must be fruit or vegetables. Aim to eat over a 12 hour period , example 7 am to 7 pm, and STOP after  your last meal of the day. Drink water,generally about 64 ounces per day, no other drink is as healthy. Fruit juice is best enjoyed in a healthy way, by EATING the fruit.    Colonoscopy due 07/2025  Thanks for choosing [facility], we consider it a privelige to serve you.

## 2024-10-26 ENCOUNTER — Encounter: Payer: Self-pay | Admitting: Family Medicine

## 2024-10-26 NOTE — Assessment & Plan Note (Signed)
 Hyperlipidemia:Low fat diet discussed and encouraged.   Lipid Panel  Lab Results  Component Value Date   CHOL 226 (H) 10/13/2024   HDL 76 10/13/2024   LDLCALC 144 (H) 10/13/2024   TRIG 37 10/13/2024   CHOLHDL 3.0 10/13/2024     Will work on food choice , total and bad cholesterol elevated

## 2024-10-26 NOTE — Progress Notes (Signed)
 "  Sarah Lutz     MRN: [id number]      DOB: November 06, 1970  Chief Complaint  Patient presents with   Medical Management of Chronic Issues    12 month follow     HPI Sarah Lutz is here for follow up and re-evaluation of chronic medical conditions, medication management and review of any available recent lab and radiology data.  Preventive health is updated, specifically  Cancer screening and Immunization.   Questions or concerns regarding consultations or procedures which the PT has had in the interim are  addressed. The PT denies any adverse reactions to current medications since the last visit.  There are no new concerns.  There are no specific complaints   ROS Denies recent fever or chills. Denies sinus pressure, nasal congestion, ear pain or sore throat. Denies chest congestion, productive cough or wheezing. Denies chest pains, palpitations and leg swelling Denies abdominal pain, nausea, vomiting,diarrhea or constipation.   Denies dysuria, frequency, hesitancy or incontinence. Denies joint pain, swelling and limitation in mobility. Denies headaches, seizures, numbness, or tingling. Denies depression, anxiety or insomnia. Denies skin break down or rash.   PE  BP 130/86   Pulse 68   Ht 4' 11 (1.499 m)   Wt 162 lb 0.6 oz (73.5 kg)   LMP  (LMP Unknown)   SpO2 98%   BMI 32.73 kg/m   Patient alert and oriented and in no cardiopulmonary distress.  HEENT: No facial asymmetry, EOMI,     Neck supple .  Chest: Clear to auscultation bilaterally.  CVS: S1, S2 no murmurs, no S3.Regular rate.  ABD: Soft non tender.   Ext: No edema  MS: Adequate ROM spine, shoulders, hips and knees.  Skin: Intact, no ulcerations or rash noted.  Psych: Good eye contact, normal affect. Memory intact not anxious or depressed appearing.  CNS: CN 2-12 intact, power,  normal throughout.no focal deficits noted.   Assessment & Plan  IGT (impaired glucose tolerance) Patient educated  about the importance of limiting  Carbohydrate intake , the need to commit to daily physical activity for a minimum of 30 minutes , and to commit weight loss. The fact that changes in all these areas will reduce or eliminate all together the development of diabetes is stressed.      Latest Ref Rng & Units 10/13/2024    1:45 PM 09/04/2011    1:54 PM 06/30/2007    5:14 PM  Diabetic Labs  HbA1c 4.8 - 5.6 % 5.8  5.8    Chol 100 - 199 mg/dL 773  799  778   HDL >60 mg/dL 76  64  77   Calc LDL 0 - 99 mg/dL 855  871  866   Triglycerides 0 - 149 mg/dL 37  41  55   Creatinine 0.57 - 1.00 mg/dL 9.18  9.13  9.00       10/23/2024    3:19 PM 10/23/2024    3:03 PM 10/23/2024    3:00 PM 10/24/2023    3:06 PM 01/22/2023    3:41 PM 11/01/2022    1:36 PM 11/01/2022    1:07 PM  BP/Weight  Systolic BP 130 136 154 136 132 120 142  Diastolic BP 86 91 94 79 82 84 80  Wt. (Lbs)   162.04 158.12 153    BMI   32.73 kg/m2 31.94 kg/m2 30.9 kg/m2         No data to display  Unchnaged , needs to workl on limiting carbs  Obesity (BMI 30.0-34.9) Deteriorated  Patient re-educated about  the importance of commitment to a  minimum of 150 minutes of exercise per week as able.  The importance of healthy food choices with portion control discussed, as well as eating regularly and within a 12 hour window most days. The need to choose clean , green food 50 to 75% of the time is discussed, as well as to make water the primary drink and set a goal of 64 ounces water daily.       10/23/2024    3:00 PM 10/24/2023    3:06 PM 01/22/2023    3:41 PM  Weight /BMI  Weight 162 lb 0.6 oz 158 lb 1.9 oz 153 lb  Height 4' 11 (1.499 m) 4' 11 (1.499 m) 4' 11 (1.499 m)  BMI 32.73 kg/m2 31.94 kg/m2 30.9 kg/m2      Essential hypertension Controlled, no change in medication Not at goal, weight loss encouraged DASH diet and commitment to daily physical activity for a minimum of 30 minutes discussed and encouraged,  as a part of hypertension management. The importance of attaining a healthy weight is also discussed.     10/23/2024    3:19 PM 10/23/2024    3:03 PM 10/23/2024    3:00 PM 10/24/2023    3:06 PM 01/22/2023    3:41 PM 11/01/2022    1:36 PM 11/01/2022    1:07 PM  BP/Weight  Systolic BP 130 136 154 136 132 120 142  Diastolic BP 86 91 94 79 82 84 80  Wt. (Lbs)   162.04 158.12 153    BMI   32.73 kg/m2 31.94 kg/m2 30.9 kg/m2         Hyperlipidemia Hyperlipidemia:Low fat diet discussed and encouraged.   Lipid Panel  Lab Results  Component Value Date   CHOL 226 (H) 10/13/2024   HDL 76 10/13/2024   LDLCALC 144 (H) 10/13/2024   TRIG 37 10/13/2024   CHOLHDL 3.0 10/13/2024     Will work on food choice , total and bad cholesterol elevated  Colon adenoma Pt aware to call for colonoscopy in 07/2025  "

## 2024-10-26 NOTE — Assessment & Plan Note (Signed)
 Pt aware to call for colonoscopy in 07/2025

## 2024-10-29 ENCOUNTER — Ambulatory Visit: Payer: Self-pay

## 2024-10-29 NOTE — Telephone Encounter (Signed)
 FYI Only or Action Required?: FYI only for provider: Pt will try home remedys.  Patient was last seen in primary care on 10/23/2024 by Antonetta Rollene BRAVO, MD.  Called Nurse Triage reporting Shoulder Pain.  Symptoms began 2 days.  Interventions attempted: OTC medications: IBU, heating pad.  Symptoms are: unchanged.  Triage Disposition: Home Care  Patient/caregiver understands and will follow disposition?: Yes                                Reason for Triage: has a sore shoulder, wants to know what to take [id number]   Reason for Disposition  Caused by strained muscle  Answer Assessment - Initial Assessment Questions 1. ONSET: When did the pain start?     2 days ago 2. LOCATION: Where is the pain located?     shoulder 3. PAIN: How bad is the pain? (Scale 1-10; or mild, moderate, severe)     moderate 4. WORK OR EXERCISE: Has there been any recent work or exercise that involved this part of the body?     Slipped and grabbed fence 5. CAUSE: What do you think is causing the shoulder pain?     Caught herself while falling and pulled shoulder 6. OTHER SYMPTOMS: Do you have any other symptoms? (e.g., neck pain, swelling, rash, fever, numbness, weakness)     no  Protocols used: Shoulder Pain-A-AH

## 2025-04-20 ENCOUNTER — Ambulatory Visit: Payer: Self-pay | Admitting: Family Medicine
